# Patient Record
Sex: Male | Born: 1940 | Race: White | Hispanic: No | Marital: Single | Smoking: Former smoker
Health system: Southern US, Community
[De-identification: ages and names within clinical notes are randomized; demographics above are authoritative.]

## PROBLEM LIST (undated history)

## (undated) DIAGNOSIS — I2510 Atherosclerotic heart disease of native coronary artery without angina pectoris: Secondary | ICD-10-CM

## (undated) DIAGNOSIS — I219 Acute myocardial infarction, unspecified: Secondary | ICD-10-CM

## (undated) DIAGNOSIS — I499 Cardiac arrhythmia, unspecified: Secondary | ICD-10-CM

## (undated) DIAGNOSIS — N400 Enlarged prostate without lower urinary tract symptoms: Secondary | ICD-10-CM

## (undated) DIAGNOSIS — I714 Abdominal aortic aneurysm, without rupture, unspecified: Secondary | ICD-10-CM

## (undated) DIAGNOSIS — J449 Chronic obstructive pulmonary disease, unspecified: Secondary | ICD-10-CM

## (undated) DIAGNOSIS — E785 Hyperlipidemia, unspecified: Secondary | ICD-10-CM

## (undated) HISTORY — DX: Abdominal aortic aneurysm, without rupture: I71.4

## (undated) HISTORY — DX: Cardiac arrhythmia, unspecified: I49.9

## (undated) HISTORY — DX: Hyperlipidemia, unspecified: E78.5

## (undated) HISTORY — DX: Benign prostatic hyperplasia without lower urinary tract symptoms: N40.0

## (undated) HISTORY — PX: CHOLECYSTECTOMY: SHX55

## (undated) HISTORY — DX: Abdominal aortic aneurysm, without rupture, unspecified: I71.40

## (undated) HISTORY — PX: APPENDECTOMY: SHX54

## (undated) HISTORY — DX: Atherosclerotic heart disease of native coronary artery without angina pectoris: I25.10

## (undated) HISTORY — DX: Chronic obstructive pulmonary disease, unspecified: J44.9

## (undated) HISTORY — DX: Acute myocardial infarction, unspecified: I21.9

---

## 1969-07-22 HISTORY — PX: FRACTURE SURGERY: SHX138

## 2000-01-25 ENCOUNTER — Inpatient Hospital Stay (HOSPITAL_COMMUNITY): Admission: EM | Admit: 2000-01-25 | Discharge: 2000-02-04 | Payer: Self-pay | Admitting: Emergency Medicine

## 2000-01-27 ENCOUNTER — Encounter: Payer: Self-pay | Admitting: Surgery

## 2000-01-27 ENCOUNTER — Encounter: Payer: Self-pay | Admitting: General Surgery

## 2000-01-28 ENCOUNTER — Encounter: Payer: Self-pay | Admitting: General Surgery

## 2000-01-29 ENCOUNTER — Encounter: Payer: Self-pay | Admitting: General Surgery

## 2000-01-30 ENCOUNTER — Encounter: Payer: Self-pay | Admitting: General Surgery

## 2000-01-31 ENCOUNTER — Encounter: Payer: Self-pay | Admitting: Surgery

## 2000-02-01 ENCOUNTER — Encounter: Payer: Self-pay | Admitting: General Surgery

## 2000-02-02 ENCOUNTER — Encounter: Payer: Self-pay | Admitting: General Surgery

## 2000-02-26 ENCOUNTER — Ambulatory Visit (HOSPITAL_COMMUNITY): Admission: RE | Admit: 2000-02-26 | Discharge: 2000-02-26 | Payer: Self-pay

## 2000-03-04 ENCOUNTER — Ambulatory Visit (HOSPITAL_COMMUNITY): Admission: RE | Admit: 2000-03-04 | Discharge: 2000-03-04 | Payer: Self-pay

## 2000-06-10 ENCOUNTER — Encounter: Payer: Self-pay | Admitting: Internal Medicine

## 2000-06-10 ENCOUNTER — Encounter: Admission: RE | Admit: 2000-06-10 | Discharge: 2000-06-10 | Payer: Self-pay | Admitting: Internal Medicine

## 2000-07-22 HISTORY — PX: ABDOMINAL AORTIC ANEURYSM REPAIR: SUR1152

## 2000-07-28 ENCOUNTER — Encounter: Payer: Self-pay | Admitting: Internal Medicine

## 2000-07-28 ENCOUNTER — Encounter: Admission: RE | Admit: 2000-07-28 | Discharge: 2000-07-28 | Payer: Self-pay | Admitting: Internal Medicine

## 2000-08-25 ENCOUNTER — Encounter: Admission: RE | Admit: 2000-08-25 | Discharge: 2000-08-25 | Payer: Self-pay | Admitting: Vascular Surgery

## 2000-08-25 ENCOUNTER — Encounter: Payer: Self-pay | Admitting: Vascular Surgery

## 2000-08-27 ENCOUNTER — Encounter: Payer: Self-pay | Admitting: Vascular Surgery

## 2000-09-01 ENCOUNTER — Ambulatory Visit (HOSPITAL_COMMUNITY): Admission: RE | Admit: 2000-09-01 | Discharge: 2000-09-01 | Payer: Self-pay | Admitting: Vascular Surgery

## 2000-09-23 ENCOUNTER — Observation Stay (HOSPITAL_COMMUNITY): Admission: RE | Admit: 2000-09-23 | Discharge: 2000-09-23 | Payer: Self-pay | Admitting: Vascular Surgery

## 2000-10-07 ENCOUNTER — Encounter: Payer: Self-pay | Admitting: Vascular Surgery

## 2000-10-07 ENCOUNTER — Inpatient Hospital Stay (HOSPITAL_COMMUNITY): Admission: RE | Admit: 2000-10-07 | Discharge: 2000-10-09 | Payer: Self-pay | Admitting: Vascular Surgery

## 2000-10-16 ENCOUNTER — Encounter: Payer: Self-pay | Admitting: Vascular Surgery

## 2000-10-16 ENCOUNTER — Ambulatory Visit (HOSPITAL_COMMUNITY): Admission: RE | Admit: 2000-10-16 | Discharge: 2000-10-16 | Payer: Self-pay | Admitting: Vascular Surgery

## 2000-11-19 ENCOUNTER — Encounter: Payer: Self-pay | Admitting: Vascular Surgery

## 2000-11-19 ENCOUNTER — Encounter: Admission: RE | Admit: 2000-11-19 | Discharge: 2000-11-19 | Payer: Self-pay | Admitting: Vascular Surgery

## 2000-12-16 ENCOUNTER — Inpatient Hospital Stay (HOSPITAL_COMMUNITY): Admission: AD | Admit: 2000-12-16 | Discharge: 2000-12-17 | Payer: Self-pay | Admitting: Cardiology

## 2000-12-16 ENCOUNTER — Encounter: Payer: Self-pay | Admitting: Cardiology

## 2001-05-27 ENCOUNTER — Encounter: Payer: Self-pay | Admitting: Vascular Surgery

## 2001-05-27 ENCOUNTER — Encounter: Admission: RE | Admit: 2001-05-27 | Discharge: 2001-05-27 | Payer: Self-pay | Admitting: Vascular Surgery

## 2002-02-22 ENCOUNTER — Ambulatory Visit (HOSPITAL_COMMUNITY): Admission: RE | Admit: 2002-02-22 | Discharge: 2002-02-22 | Payer: Self-pay | Admitting: Gastroenterology

## 2002-02-22 ENCOUNTER — Encounter (INDEPENDENT_AMBULATORY_CARE_PROVIDER_SITE_OTHER): Payer: Self-pay | Admitting: Specialist

## 2002-05-27 ENCOUNTER — Encounter: Admission: RE | Admit: 2002-05-27 | Discharge: 2002-05-27 | Payer: Self-pay | Admitting: Vascular Surgery

## 2002-05-27 ENCOUNTER — Encounter: Payer: Self-pay | Admitting: Vascular Surgery

## 2002-12-28 ENCOUNTER — Encounter: Admission: RE | Admit: 2002-12-28 | Discharge: 2003-03-28 | Payer: Self-pay | Admitting: Internal Medicine

## 2003-03-30 ENCOUNTER — Encounter: Payer: Self-pay | Admitting: Internal Medicine

## 2003-03-30 ENCOUNTER — Encounter: Admission: RE | Admit: 2003-03-30 | Discharge: 2003-03-30 | Payer: Self-pay | Admitting: Internal Medicine

## 2003-06-01 ENCOUNTER — Encounter: Admission: RE | Admit: 2003-06-01 | Discharge: 2003-06-01 | Payer: Self-pay | Admitting: Vascular Surgery

## 2004-06-06 ENCOUNTER — Encounter: Admission: RE | Admit: 2004-06-06 | Discharge: 2004-06-06 | Payer: Self-pay | Admitting: Vascular Surgery

## 2004-06-06 ENCOUNTER — Inpatient Hospital Stay (HOSPITAL_COMMUNITY): Admission: EM | Admit: 2004-06-06 | Discharge: 2004-06-12 | Payer: Self-pay | Admitting: Emergency Medicine

## 2004-06-08 ENCOUNTER — Encounter: Payer: Self-pay | Admitting: Cardiology

## 2005-01-24 ENCOUNTER — Ambulatory Visit (HOSPITAL_COMMUNITY): Admission: RE | Admit: 2005-01-24 | Discharge: 2005-01-24 | Payer: Self-pay | Admitting: Internal Medicine

## 2005-01-28 ENCOUNTER — Ambulatory Visit (HOSPITAL_COMMUNITY): Admission: RE | Admit: 2005-01-28 | Discharge: 2005-01-29 | Payer: Self-pay | Admitting: Neurosurgery

## 2006-10-08 ENCOUNTER — Ambulatory Visit (HOSPITAL_COMMUNITY): Admission: RE | Admit: 2006-10-08 | Discharge: 2006-10-08 | Payer: Self-pay | Admitting: Neurosurgery

## 2006-12-17 ENCOUNTER — Ambulatory Visit: Payer: Self-pay | Admitting: Vascular Surgery

## 2008-07-19 ENCOUNTER — Ambulatory Visit: Payer: Self-pay | Admitting: Vascular Surgery

## 2010-12-04 NOTE — Assessment & Plan Note (Signed)
OFFICE VISIT   Jimmy Evans, Jimmy Evans  DOB:  1940/12/06                                       12/17/2006  EAVWU#:[id number]   SUBJECTIVE:  I saw Jimmy Evans in the office today for follow up after  previous placement of an AneuRx stent graft back in March of 2002.  His  aneurysm shrunk down nicely around the stent graft after his repair;  therefore we have only been following this at year-and-a-half intervals.  He comes in for a routine follow up visit.  He has had no significant  abdominal pain.  He has had some low back pain and is followed by Dr.  Jordan Likes who has done some work on his lumbar spine.  He apparently is going  to require some more surgery in the Fall.   REVIEW OF SYSTEMS:  On a review of systems he has had no significant  chest pain, chest pressure, palpitations, or arrhythmias.  He has had no  bronchitis, asthma or wheezing.  He has had no change in his bowel  habits.  He has had no claudication,  rest pain and denied any ulcers.   SOCIAL HISTORY:  He does continue to smoke a pack per day of cigarettes.   PHYSICAL EXAMINATION:  Blood pressure is 98/59, heart rate is 69.  I did  not detect any carotid bruits.  Lungs are clear bilaterally to  auscultation.  On cardiac exam he has a regular rate and rhythm.  His  abdomen is soft and nontender.  He has palpable femoral and pedal pulses  bilaterally.   I did review his duplex scan which shows the maximum diameter of his  aneurysm is 3.2 cm and has not changed significantly over the last year  and a half.  Renal arteries are patent and the graft is noted to be  patent with no evidence of endoleak.   ASSESSMENT/PLAN:  Overall I am pleased with his progress and I will see  him back in a year and a half.  He knows to call sooner if he has  problems.   Di Kindle. Edilia Bo, M.D.  Electronically Signed   CSD/MEDQ  D:  12/17/2006  T:  12/17/2006  Job:  [id number]

## 2010-12-04 NOTE — Procedures (Signed)
ENDOVASCULAR STENT GRAFT EXAM   INDICATION:  Followup abdominal aortic aneurysm stent.   HISTORY:  Abdominal aortic aneurysm stent placed on 10/07/2000.                           DUPLEX EVALUATION   AAA Sac Size:                 3.3 CM AP             3.0 CM TRV  Previous Sac Size:            3.2 CM AP             CM TRV  Evidence of an endoleak?      No   Velocity Criteria:  Proximal Aorta                Not visualized  Proximal Stent Graft          65 cm/sec  Main Body Stent Graft-Mid     54 cm/sec  Right Limb-Proximal           63 cm/sec  Right Limb-Distal             51 cm/sec  Left Limb-Proximal            46 cm/sec  Left Limb-Distal              33 cm/sec  Patent Renal Arteries?        Yes    IMPRESSION:  1. Patent abdominal aortic aneurysm stent with no evidence of stenosis      or leakage noted.  2. The proximal aorta was not adequately visualized due to overlying      bowel gas patterns.  3. No significant change noted when compared to the previous exam on      12/17/2006.         ___________________________________________  Di Kindle. Edilia Bo, M.D.   CH/MEDQ  D:  07/20/2008  T:  07/20/2008  Job:  [id number]

## 2010-12-04 NOTE — Procedures (Signed)
LOWER EXTREMITY ARTERIAL EVALUATION-SINGLE LEVEL   INDICATION:  Follow-up abdominal aortic aneurysm stent.   HISTORY:  Diabetes:  No.  Cardiac:  Atrial fibrillation.  Hypertension:  No.  Smoking:  Yes.  Previous Surgery:  Abdominal aortic aneurysm stent placed on 10/07/2000.   RESTING SYSTOLIC PRESSURES: (ABI)                          RIGHT                LEFT  Brachial:               120                  116  Anterior tibial:          96  (0.8)            104  (0.87)  Posterior tibial:       90                    86  Peroneal:  DOPPLER WAVEFORM ANALYSIS:  Anterior tibial:        Biphasic             Biphasic  Posterior tibial:       Biphasic             Biphasic  Peroneal:   PREVIOUS ABI'S:  Date:  RIGHT:  LEFT:   IMPRESSION:  Mildly decreased bilateral ankle brachial indices with  biphasic Doppler waveforms noted.   ___________________________________________  Jimmy Evans. Edilia Bo, M.D.   CH/MEDQ  D:  07/19/2008  T:  07/19/2008  Job:  [id number]

## 2010-12-04 NOTE — Assessment & Plan Note (Signed)
OFFICE VISIT   ARLEY, SALAMONE R  DOB:  [birth year]                                       07/19/2008  [id number]#:[id number]   I saw the patient in the office today for continued followup of his  AneuRx stent graft which was placed for a 5 cm aneurysm back in March of  2002.  On his followup studies the aneurysm slowly has shrunk around the  stent graft with no evidence of endoleak.  We have been following this  at year and a half intervals.  Since I last saw him in May of 2008 he  has had no significant abdominal pain.  He does have some low back pain  and is being considered for a lumbar fusion.   PAST MEDICAL HISTORY:  Has not changed significantly since I saw him  last.   REVIEW OF SYSTEMS:  He has had no recent chest pain, chest pressure,  palpitations or arrhythmias.  He has had no productive cough bronchitis,  asthma or wheezing.  Unfortunately he does continue to smoke  intermittently.   PHYSICAL EXAMINATION:  General:  This is a pleasant 70 year old  gentleman who appears his stated age.  Vital signs:  Blood pressure is  108/78, heart rate is 86.  Neck:  Supple.  I do not detect any carotid  bruits.  Lungs:  Are clear bilaterally to auscultation.  Cardiac:  He  has a regular rate and rhythm.  Abdomen:  Soft and nontender.  I cannot  palpate his aneurysm.  Vascular:  He has palpable femoral pulses with  warm and well-perfused feet and no significant lower extremity swelling.   Duplex scan of his graft today shows the maximum size of the aneurysm is  3.3 cm and has not changed over the last year and a half.  The stent  graft appears to be in good position with patent renal arteries.   Overall I am pleased with his progress and given that the aneurysm has  shrunk down around the graft I think we can stretch his followup out to  2 years.  I will see him back at that time.  He knows to call sooner if  he has problems.   Di Kindle. Edilia Bo, M.D.  Electronically Signed   CSD/MEDQ  D:  07/19/2008  T:  07/20/2008  Job:  [id number]

## 2010-12-07 NOTE — Op Note (Signed)
   TNAMEBRAYDYN, SCHULTES                            ACCOUNT NO.:  [id number]   MEDICAL RECORD NO.:  [id number]                   PATIENT TYPE:  AMB   LOCATION:  ENDO                                 FACILITY:  [facility]   PHYSICIAN:  Charolett Bumpers, M.D.             DATE OF BIRTH:   DATE OF PROCEDURE:  02/22/2002  DATE OF DISCHARGE:  02/22/2002                                 OPERATIVE REPORT   PROCEDURE:  Colonoscopy and polypectomy.   INDICATIONS FOR PROCEDURE:  Mr. Jimmy Evans is a 70 year old male born January 08, 1941. Mr. Jimmy Evans is undergoing diagnostic colonoscopy to evaluate guaiac  positive stool.   I discussed with Mr. Jimmy Evans the complications associated with colonoscopy and  polypectomy including a 15/1000 risk of bleeding and 10/998 risk of colon  perforation requiring surgical repair. Mr. Jimmy Evans has signed the operative  permit.   ENDOSCOPIST:  Charolett Bumpers, M.D.   PREMEDICATION:  Versed 5 mg, Demerol 50 mg .   ENDOSCOPE:  Olympus pediatric colonoscope.   DESCRIPTION OF PROCEDURE:  After obtaining informed consent, Mr. Jimmy Evans was  placed in the left lateral decubitus position. I administered intravenous  Demerol and intravenous Versed to achieve conscious sedation for the  procedure. The patient's blood pressure, oxygen saturation and cardiac  rhythm were monitored throughout the procedure and documented in the medical  record.   Anal inspection was normal. Digital rectal exam revealed a nonnodular  prostate. The Olympus pediatric video colonoscope was introduced into the  rectum and advanced to the cecum. Colonic preparation for the exam today was  satisfactory.   RECTUM:  Normal.   SIGMOID COLON AND DESCENDING COLON:  Normal.   SPLENIC FLEXURE:  Normal.   TRANSVERSE COLON:  Normal.   HEPATIC FLEXURE:  Normal.   ASCENDING COLON:  From the mid ascending colon, a 3 mm sessile polyp was  removed with electrocautery snare and submitted for pathological  interpretation.   CECUM AND ILEOCECAL VALVE:  Normal.   ASSESSMENT:  From the mid ascending colon, a 3 mm sessile polyp was removed  with electrocautery snare; otherwise normal proctocolonoscopy to the cecum.   RECOMMENDATIONS:  If ascending polyp returns neoplastic pathologically, Mr.  Jimmy Evans should undergo a repeat colonoscopy in five years.                                                 Charolett Bumpers, M.D.    MKJ/MEDQ  D:  02/22/2002  T:  02/26/2002  Job:  [id number]   cc:   Thora Lance, M.D.
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
## 2010-12-07 NOTE — Discharge Summary (Signed)
NAMEDAMIER, DISANO NO.:  [id number]   MEDICAL RECORD NO.:  [id number]          PATIENT TYPE:  INP   LOCATION:  4737                         FACILITY:  [facility]   PHYSICIAN:  Jimmy Evans, M.D.  DATE OF BIRTH:  24-May-1941   DATE OF ADMISSION:  06/07/2004  DATE OF DISCHARGE:  06/12/2004                                 DISCHARGE SUMMARY   HISTORY OF PRESENT ILLNESS:  Jimmy Evans is a 70 year old white male with a  history of known chronic atrial fibrillation, a history of tobacco abuse,  hypercholesterolemia and vascular disease, who presents with symptoms of  prolonged chest pain radiating down his left arm.  It is associated with  shortness of breath.  He also has a persistent cough.  The patient has had  prior stent graft for abdominal aortic aneurysm.  He had been evaluated in  2002 with a Cardiolite study which showed a question of a reversible  inferior wall defect.  Subsequent cardiac catheterization showed only  nonobstructive coronary disease.  For details of his past medical history,  social history, family history and physical exam, please see the admission  history and physical.   LABORATORY DATA:  The white count was 7700, hemoglobin 12, hematocrit 34.3  and platelets 177,000.  Coagulation times were normal.  Sodium 134,  potassium 3.4, chloride 107, CO2 23, BUN 8, creatinine 1.0, glucose 98,  calcium 8.0.  The BNP level was 327.  The TSH was 3.89.  The ferritin level  was 1.82.  ECG on admission showed atrial fibrillation with a rapid  ventricular response and nonspecific ST-T wave abnormality.  The patient had  CT of the chest and abdomen.  This showed an aortic stent graft that  appeared to be in excellent position.  There was no obvious endo leak.  The  abdomen appeared benign.  Chest CT showed no evidence of pulmonary embolus  or thoracic aortic dissection.  There was evidence of emphysema, but no  active disease.  Chest x-ray showed mild cardiac  enlargement.  Cardiac  enzymes showed a troponin of 2.86 and subsequent repeat was 3.05.  CK was  205 with an MB of 20.9.  Repeat was 214 with 19.3 MB.   HOSPITAL COURSE:  The patient was admitted to the intensive care unit.  He  was treated with anticoagulation with heparin.  He was started on an IV  nitroglycerin drip.  Subsequent cardiac enzymes suggested a non-Q-wave  myocardial infarction, although his ECG showed no significant change.  It  was evident from his history that he has a history of tobacco abuse, as well  as alcohol abuse, drinking up to a fifth per week by his confession.  Potassium was low and this was repleted.  On June 07, 2004, he underwent  cardiac catheterization.  This demonstrated scattered nonobstructive  coronary disease.  There was 20% in the proximal LAD and 20-40% disease in  the mid LAD.  The left circumflex and right coronary artery had less than or  equal to 10% irregularities.  Left ventricular function showed global  hypokinesia with an ejection fraction of 40%.  There was no mitral  insufficiency.  The major finding on this exam was the fact that his left  ventricular function had decreased significant since 2002.  This was felt to  be partly explained by his history of alcohol abuse, as well as history of  atrial fibrillation with perhaps insufficient heart rate control.  Because  of his left ventricular function, we stopped his Cardizem and Toprol.  He  was begun on Coreg, Lanoxin and ACE inhibitor.  He was continued on  anticoagulation with heparin and begun on Coumadin therapy.  As mentioned,  his TSH and ferritin were normal.  SPAP was pending at the time of  discharge.  He had an echocardiogram obtained which showed similar findings  with global hypokinesis and overall moderate left ventricular dysfunction  with ejection fraction 30-40%.  There was mild biatrial enlargement.  Valvular function was normal.  The patient also was treated with  Zithromax  for his apparent bronchitis with persistent cough.  This resulted in marked  improvement in his cough symptoms that had resolved by the time of  discharge.  He continued to do very well.  We had to progressively titrate  his medications to achieve adequate rate control, but with a combination of  Coreg 25 mg b.i.d. and Lanoxin 0.375 mg daily, he appeared to have achieved  excellent rate control.  While his Coumadin was subtherapeutic at the time  of discharge, it was noted that he had been in chronic atrial fibrillation  off of Coumadin prior to admission.  So while his discharge INR was 1.4, it  was felt that this could be adjusted further as an outpatient.  The patient  was asymptomatic and stable at discharge.  Prior to discharge, his digoxin  level was 0.6.  The pro time was 15.9 with an INR of 1.4.  The CBC and BMET  were normal.  The patient was strongly counseled on the need for both  alcohol and tobacco abstinence.  He is very interested in pursuing these  goals.   DISCHARGE DIAGNOSES:  1.  Dilated nonischemic cardiomyopathy.  2.  Atrial fibrillation, chronic.  3.  Anticoagulation.  4.  Acute bronchitis, resolved.  5.  History of alcohol abuse.  6.  History of tobacco abuse.  7.  Hypercholesterolemia.  8.  Status post stent graft for abdominal aortic aneurysm.  9.  Nonobstructive coronary disease.   DISCHARGE MEDICATIONS:  1.  Wellbutrin XL 300 mg per day.  2.  Lipitor 20 mg per day.  3.  Flomax 0.4 mg daily.  4.  Potassium 20 mEq per day.  5.  Lisinopril 10 mg per day.  6.  Furosemide 20 mg per day.  7.  Coreg 25 mg twice a day.  8.  Lanoxin 0.375 mg daily.  9.  Coumadin 5 mg.  He is to take two tablets on Tuesday, Wednesday and      Thursday.   FOLLOWUP:  He is to come to our office on Friday, June 15, 2004, for his  next pro time.  Otherwise he will follow up with Dr. [place] in two weeks.  SPECIAL INSTRUCTIONS:  He will abstain from alcohol and  smoking.   DIET:  He will follow a low-salt, low-fat diet.   DISCHARGE STATUS:  Improved.       PMJ/MEDQ  D:  06/12/2004  T:  06/12/2004  Job:  [id number]   cc:   Thora Lance, M.D.  [address]  [place]  [place] [address]  Fax: [phone]   Di Kindle. Edilia Bo, M.D.  [address]  [place]  [place] [address]

## 2010-12-07 NOTE — Cardiovascular Report (Signed)
NAMEMARQUE, RADEMAKER NO.:  [id number]   MEDICAL RECORD NO.:  [id number]          PATIENT TYPE:  INP   LOCATION:  6522                         FACILITY:  [facility]   PHYSICIAN:  Peter M. Swaziland, M.D.  DATE OF BIRTH:  [birth year]   DATE OF PROCEDURE:  DATE OF DISCHARGE:                              CARDIAC CATHETERIZATION   INDICATION FOR PROCEDURE:  Patient is a 70 year old white male who presented  after a prolonged episode of chest pain and had positive cardiac troponins.  He has a history of chronic atrial fibrillation and also has a history of  chronic alcohol and tobacco abuse.   PROCEDURE:  1.  Left heart catheterization.  2.  Coronary angiography.  3.  Left ventricular angiography.   ACCESS:  Via the right femoral artery using standard Seldinger technique.   EQUIPMENT:  6-French 4 cm right and left Judkins catheter, 6-French pigtail  catheter, 6-French arterial sheath.   CONTRAST:  100 mL of Omnipaque.   MEDICATIONS:  Versed 2 mg IV.   HEMODYNAMIC DATA:  1.  Aortic pressure 101/65 with a mean of 81 mmHg.  2.  Left ventricular pressure 107 with EDP 19 mmHg.   ANGIOGRAPHIC DATA:  1.  The left coronary artery arises and distributes normally.  The left main      coronary artery is normal.  2.  Left anterior descending artery has 20% irregularity in the proximal      vessel.  There is tapering down to 30-40% in the mid vessel, but no      focal stenosis in this region.  3.  The left circumflex coronary artery is a codominant vessel.  There are      minor irregularities less than 10%.  4.  The right coronary artery arises normally.  There is a 10-20% narrowing      in the mid vessel.   LEFT VENTRICULOGRAPHY:  Left ventricular angiography was performed in the  RAO view.  This demonstrates normal left ventricular size with global  hypokinesia.  There is slightly more hypokinesia involving the inferior  wall.  Overall ejection fraction is 40% consistent  with moderate left  ventricular dysfunction.  There is no significant mitral insufficiency.   FINAL INTERPRETATION:  1.  Minimal nonobstructive atherosclerotic coronary artery disease.  2.  Moderate left ventricular dysfunction.   PLAN:  Recommend medical therapy.       PMJ/MEDQ  D:  06/07/2004  T:  06/08/2004  Job:  [id number]   cc:   Thora Lance, M.D.  [address]  [place]  [place] [address]  Fax: [phone]
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
## 2010-12-07 NOTE — Discharge Summary (Signed)
[place]. [facility]  Patient:    Jimmy Evans, Jimmy Evans                         MRN: [id number] Dictator:   Marlowe Kays, P.A.                           Discharge Summary  ADDENDUM:  The patient was scheduled for an aneurysm stent graft repair on September 30, 2000.  However, the EKG taken during preoperative work-up revealed atrial fibrillation.  His surgery was canceled for further evaluation of his case.  It is now scheduled for October 07, 2000, if no other complications arise. DD:  09/23/00 TD:  09/23/00 Job: [id number]

## 2010-12-07 NOTE — Op Note (Signed)
Jimmy Evans, Jimmy Evans                   ACCOUNT NO.:  [id number]   MEDICAL RECORD NO.:  [id number]          PATIENT TYPE:  OIB   LOCATION:  3013                         FACILITY:  [facility]   PHYSICIAN:  Kathaleen Maser. Pool, M.D.    DATE OF BIRTH:  [birth year]   DATE OF PROCEDURE:  01/28/2005  DATE OF DISCHARGE:                                 OPERATIVE REPORT   SERVICE:  Neurosurgery.   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   PROCEDURE NAME:  Left 3-4 laminotomy and microdiskectomy.   SURGEON:  Julio Sicks, M.D.   ASSISTANT:  Donalee Citrin, M.D.   ANESTHESIA:  General orotracheal.   INDICATIONS:  Mr. Whetsell is a 70 year old male with a history of severe back  and left lower extremity pain consistent with a left-sided L3 radiculopathy,  failing all conservative management.  Workup demonstrates evidence of a  large left sided L3-4 disk herniation with a superior free fragment causing  marked compression of the left sided L3 nerve root.  The patient had been  counseled as to his options.  He decided to proceed with a left sided  laminotomy and microdiskectomy in hopes of improvement of his symptoms.   OPERATIVE NOTE:  The patient on the operating table in a supine position.  After adequate level of anesthesia achieved, the patient was prone on the  Wilson frame and appropriately padded.  Lumbar regions were prepped and  draped sterilely.  A 10 blade was used to make a linear skin incision  overlying the L3-4 interspace.  This was carried down sharply in the  midline.  A subperiosteal dissection was then performed exposing the lamina  facet joints of L3 and L4 on the left side.  Deep self retaining retractor  was placed.  Intraoperative x-rays taken, level was confirmed.  A laminotomy  was then performed using a high speed drill and Kerrison rongeurs to remove  the  inferior aspect of the lamina of L3, medial aspect of the L3-4 facet  joint and superior rim of the L4 lamina.  The ligamentum flavum was then  elevated and resected in the usual fashion using Kerrison rongeurs.  The  underlying thecal sac and exiting L4 nerve root were identified.  Microscope  was brought into the field.  I did microdissection of the spinal canal and  disk herniation.  __________.  The thecal sac and L4 nerve root were  mobilized and directed towards the midline.  The superior free fragment was  readily identified.  This was dissected free using blunt nerve hooks and the  pituitary rongeurs.  A very large amount of free superior fragmented disk  herniation was removed all the way up to the level of the left sided L3  pedicle.  The L3 nerve root was identified and found to be free of any  further compression.  After all elements of the superior disk herniation  were removed, the disk space was isolated and a size 15 blade in  a  rectangular fashion and wide disk space clean out was then achieved using  pituitary rongeurs, up-biting rongeurs and Epstein curets.  All __________  disk material was removed from the interspace.  All elements of the disk  herniation had been completely resected.  Wound was then irrigated with  antibiotic solution.  Gelfoam was placed topically and hemostasis found to  be good.  Microscope and retractor system were removed.  Hemostasis of the  muscle was achieved with electrocautery.  The wound was then closed in  layers with Vicryl sutures.  Steri-Strips and sterile dressing were applied.  There were no apparent operative complications.  He tolerated the procedure  well and he returned to the recovery room postop.       HAP/MEDQ  D:  01/28/2005  T:  01/28/2005  Job:  [id number]

## 2010-12-07 NOTE — Cardiovascular Report (Signed)
[place]  Patient:    Jimmy Evans, Jimmy Evans                          MRN: [id number] Proc. Date: 12/17/00 Adm. Date:  [id number] Attending:  [place], Peter Manning CC:         Di Kindle. Edilia Bo, M.D.             Thora Lance, M.D.                        Cardiac Catheterization  INDICATIONS FOR PROCEDURE:  The patient is a 70 year old, white male, with a history of tobacco abuse and hypercholesterolemia and a history of paroxysmal atrial fibrillation, who presents with refractory chest pain.  The patient had a prior stress Cardiolite study in February of 2002 that was equivocal showing a fixed defect inferiorly.  ACCESS:  Via the right femoral artery using the standard Seldinger technique.  EQUIPMENT:  A 6 French 4 cm right and left Judkins catheter, 6 French pigtail catheter, 6 French arterial sheath.  MEDICATIONS:  Local anesthesia with 1% Xylocaine.  CONTRAST:  Omnipaque 130 cc.  HEMODYNAMIC DATA:  Aortic pressure is 99/62, left ventricular pressure is 97 with an EDP of 15 mmHg.  The patient was in normal sinus rhythm during the procedure.  ANGIOGRAPHIC DATA:  Left coronary artery:  The left coronary artery arises and distributes normally.  Left main:  The left main coronary artery is normal.  Left anterior descending:  The left anterior descending artery has minimal ectasia proximally.  The LAD tapers down in the mid vessel but no significant plaque or obstruction is seen.  There is only about a 10% irregularity in the proximal LAD.  Left circumflex:  The left circumflex appears normal.  Right coronary artery:  The right coronary artery arises and distributes normally and is a normal vessel.  LEFT VENTRICULAR ANGIOGRAPHY:  The left ventricular angiography is performed in the RAO and LAO cranial views.  This demonstrates normal left ventricular size and contractility with normal systolic function.  The ejection fraction is estimated  at 55%.  There is no mitral regurgitation or prolapse.  FINAL INTERPRETATION: 1. No significant atherosclerotic coronary artery disease. 2. Normal left ventricular function. DD:  12/17/00 TD:  12/17/00 Job: [id number]

## 2010-12-07 NOTE — H&P (Signed)
NAMEFERLIN, Jimmy Evans NO.:  [id number]   MEDICAL RECORD NO.:  [id number]          PATIENT TYPE:  EMS   LOCATION:  ED                           FACILITY:  [facility]   PHYSICIAN:  Jimmy Evans, M.D. DATE OF BIRTH:  Feb 15, 1941   DATE OF ADMISSION:  06/06/2004  DATE OF DISCHARGE:                                HISTORY & PHYSICAL   HISTORY:  Jimmy Evans is a 70 year old gentleman with a history of an abdominal  aortic aneurysm repair (wall stent), chronic atrial fibrillation who was  admit with chest pain.   The patient has a history of atrial fibrillation dating back to 2001.  He  has been seen by Dr. Swaziland in the past but has not been seen in 2 1/2  years.  He had an abnormal Cardiolite study in 2002 which revealed a fixed  inferior defect.  He had a cath several months following that just before he  had his abdominal aortic aneurysm repair. The cath revealed only mild  irregularities.  The tightest stenosis was perhaps 10%. The patient had a  stent graft repair of his abdominal aortic aneurysm and has done fairly  well.  He has done well and has not been seen by Dr. Swaziland in quite some  time.   Today, he went for a chest x-ray and further evaluation of his stent graft.  He was not found to have any abnormalities.   Later in the day, he developed severe chest pain.  The pain was substernal  in nature and radiated out to the left arm.  It was described as somewhat of  a tearing and burning sensation.  The pain was not relieved with antacids.  He came to the emergency room and the pain was fairly quickly relieved with  sublingual nitroglycerin.  He had some very vague chest pains but no severe  pains at this point.   CURRENT MEDICATIONS:  1.  Wellbutrin XL 300 mg q.d.  2.  Diltiazem slow release 120 mg q.d.  3.  Flomax 0.4 mg q.d.  4.  Lipitor 20 mg q.d.  5.  Toprol XL 25 mg q.d.   ALLERGIES:  None.   SOCIAL HISTORY:  The patient is a long time smoker. He  quit smoking (once  again) approximately 1-2 weeks ago.  He works as a Warehouse manager.   FAMILY HISTORY:  Noncontributory.   REVIEW OF SYMPTOMS:  The patient has done fairly well. He has not had any  episodes of angina.   PHYSICAL EXAMINATION:  GENERAL:  He is a middle-aged gentleman in no acute  distress.  He is alert and oriented x3 and his mood and affect are normal.  VITAL SIGNS:  His heart rate is 100, his blood pressure is 124/81, his  respiratory rate is normal.  HEENT:  He has no JVD and normal carotids.  LUNGS:  Clear to auscultation.  HEART:  Irregularly irregular.  EXTREMITIES:  He has good pulses.  He has symmetric pulses in both arms. He  has no clubbing, cyanosis or edema.  NEUROLOGIC:  Nonfocal.   His EKG reveals atrial fibrillation with a rapid ventricular response.  He  has nonspecific ST-T wave abnormalities.   His laboratory data reveals a white blood cell count of 7.0, hematocrit of  40.  Sodium is 142, potassium is 4.0, chloride is 107, CO2 is 25, BUN is 13,  creatinine is 1.4.  His pro time is 13.  CPK-MB is 1.6 with a troponin of  0.05.   His chest x-ray CT is currently being performed and is pending.   IMPRESSION/PLAN:  Chest pain.  The chest pain is suspicious for coronary  artery disease.  He had minimal coronary artery disease by heart  catheterization approximately three years ago but his history today is  certainly suspicious.  He has continued smoking.   He has aneurysm repair checked out by x-ray today but certainly he could  have a thoracic aneurysm dissection.  The quality of his pain is not all  that suspicious but certainly he has known aortic disease.  We will admit  him to the hospital. We will check cardiac enzymes and check and EKG in the  morning.  Dr. Swaziland will see him for further evaluation and management.      PJN/MEDQ  D:  06/06/2004  T:  06/07/2004  Job:  [id number]   cc:   Thora Lance, M.D.  [address] [place]  [place] [address]  Fax: [phone]   Peter M. Swaziland, M.D.  [address]  [place], [place] [address]  Fax: [phone]   Di Kindle. Edilia Bo, M.D.  [address]  [place]  [place] [address]
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
## 2010-12-07 NOTE — Discharge Summary (Signed)
[place]. [facility]  Patient:    Jimmy, Evans                          MRN: [id number] Adm. Date:  [id number] Disc. Date: [id number] Attending:  [place], Peter Manning                           Discharge Summary  HISTORY OF PRESENT ILLNESS:  Jimmy Evans is a 70 year old white male who has a history of prior stent grafting for abdominal aortic aneurysm on October 05, 2000.  Since then he has complained of left precordial chest tightness.  This is not exertional.  The evening of admission developed severe left precordial pain with a burning sensation, nausea, vomiting, sweating, and weakness.  He also had left arm pain.  The patient had had prior Adenosine Cardiolite study earlier this year which showed a fixed defect inferiorly.  He was admitted to rule out myocardial infarction.  PAST MEDICAL HISTORY: SOCIAL HISTORY: FAMILY HISTORY: PHYSICAL EXAMINATION:  For details, please see admission history and physical.  HOSPITAL COURSE:  The patient was admitted to telemetry.  Subsequently ruled out for myocardial infarction by serial cardiac enzymes.  He was started on IV heparin.  Initially the patient was in atrial fibrillation in our office, but by the time he was admitted to the hospital, he had converted to normal sinus rhythm.  He was continued on aspirin and heparin.  On Dec 17, 2000, the patient underwent cardiac catheterization.  This demonstrated mild ectasia in the left anterior descending artery with approximately 10% irregularity proximally.  The circumflex and right coronary artery appeared normal.  His left ventricular function was normal and ejection fraction 55%.  The patient also had an echocardiogram performed, but the results are not on the chart at the time of dictation.  Further review of his history, he had had increased alcohol use recently over the [facility].  It was felt that this may have triggered his atrial fibrillation episode.   The patient was discharged on the same day.  DISCHARGE DIAGNOSES: 1. Atrial fibrillation with rapid ventricular response, resolved. 2. Chest pain, noncardiac. 3. Tobacco abuse. 4. Chronic obstructive pulmonary disease. 5. Status post stent graft for abdominal aortic aneurysm.  DISCHARGE MEDICATIONS: 1. Aspirin 325 mg per day. 2. Cardizem CD 240 mg a day. 3. Zantac or Pepcid A.C. p.r.n. indigestion.  DIET:  The patient is to follow a low fat diet.  DISCHARGE INSTRUCTIONS:  He is instructed to stop smoking and to avoid alcohol. He will follow up with Dr. Swaziland in 7 to 10 days.  CONDITION ON DISCHARGE:  Improved. DD:  12/25/00 TD:  12/26/00 Job: [id number]
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
## 2010-12-07 NOTE — Discharge Summary (Signed)
NAMEBRIA, PORTALES NO.:  [id number]   MEDICAL RECORD NO.:  [id number]          PATIENT TYPE:  INP   LOCATION:  4737                         FACILITY:  [facility]   PHYSICIAN:  Peter M. Swaziland, M.D.  DATE OF BIRTH:  12-05-1940   DATE OF ADMISSION:  06/06/2004  DATE OF DISCHARGE:  06/12/2004                                 DISCHARGE SUMMARY   HISTORY OF PRESENT ILLNESS:  Mr. Ayre is a 70 year old, white male status  post stent graft for an abdominal aortic aneurysm.  He has a history of  chronic atrial fibrillation.  He was admitted with symptoms of severe chest  pain.  The patient does have a history of tobacco and alcohol abuse.  He  quit smoking 1-2 weeks prior to admission.   For details his past medical history, social history, family history and  physical examination please see admission history and physical.   LABORATORY DATA:  His EKG showed atrial fibrillation with rapid ventricular  response, rate of 110, and nonspecific T wave abnormality.  He had a CT of  the chest and abdomen with contrast.  The stent graft was patent.  There did  not appear to be an endo leak.  There was no evidence of pulmonary embolus  or thoracic dissection.  He had evidence of emphysema.  There was also fatty  infiltration of the liver.  Chest x-ray showed upper limits of normal heart  size with mild peri-bronchial thickening.  Additional laboratory data, white  count was 7000, hemoglobin 14, hematocrit 39.9, platelets 199,000.  PT was  13 with INR of 1.0. Sodium 142, potassium 4.1, chloride 107, CO2 25. BUN 13,  creatinine 1.4, glucose 112.  Liver function studies were normal except  total bilirubin of 2.0.  CK was 205 with 20.9 MB, followup CK was 214 with  19.3 MB.  Troponin was 2.86 and 3.05.  Serum protein electrophoresis showed  nonspecific pattern.  BNP level was elevated at 327.  TSH 3.892, ferritin  182, digitalis level was 0.6.   HOSPITAL COURSE:  The patient was  admitted.  He was treated with IV heparin  and IV nitroglycerin.  He was also on Cardizem and Toprol for rate control.  Echocardiogram was obtained which demonstrated moderate global hypokinesia  with ejection fraction estimated at 30-40%.  Both atria were mildly dilated.  There were no significant valvular abnormalities.  On June 07, 2004 the  patient underwent a cardiac catheterization.  This demonstrated non-  obstructive coronary artery disease.  There was 20-40% narrowing in the mid  LAD with less than 10% irregularity of the circumflex and right coronary  artery.  Again, left ventricular function was globally depressed with  ejection fraction estimated at 40%.  Based on this data it was felt the  patient's moderate cardiomyopathy was related to chronic alcohol abuse.  He  was counseled on the need for alcohol and tobacco abstinence.   We changed his Cardizem and Toprol to a combination of Lanoxin and Coreg for  rate control.  He was started on ACE inhibitor and  this was progressive  titrated up.  With increasing doses of Lanoxin and Coreg we did achieve  adequate rate control.  He was started on anticoagulation with Coumadin.  He  also had significant cough and bronchitis which was treated with Zithromax  with resolution of his symptoms.   At the time of discharge his Pro Time was elevated at 15.9 with INR of 1.4.  It was felt we could adjust his Coumadin further as an outpatient and he was  discharged home in stable condition on June 12, 2004.   DISCHARGE DIAGNOSES:  1.  Non-Q wave myocardial infarction with non-obstructive coronary disease      by catheterization.  2.  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  3.  Alcohol abuse.  4.  Tobacco abuse.  5.  Chronic atrial fibrillation.  6.  Status post stent graft for abdominal aortic aneurysm.   DISCHARGE MEDICATIONS:  1.  Wellbutrin XL 300 mg daily.  2.  Lipitor 20 mg daily.  3.  Flomax 0.4  mg daily.  4.  Potassium 20 mEq daily.  5.  Lisinopril 10 mg daily.  6.  Furosemide 20 mg daily.  7.  Coreg 25 mg twice a day.  8.  Lanoxin 0.375 mg daily.  9.  Coumadin 5 mg two tablets on Tuesday, Wednesday and Thursday.   Followup Pro Time Friday in the office.  The patient was counseled on a low  sodium, low fat diet.  Recommended complete alcohol and smoking cessation.  Will followup with office visit with Dr. [place] in two weeks.   Discharge status is improved.       PMJ/MEDQ  D:  07/26/2004  T:  07/26/2004  Job:  [id number]   cc:   Di Kindle. Edilia Bo, M.D.  [address]  [place]  [place] [address]   Thora Lance, M.D.  [address]  [place]  [place] [address]  Fax: [phone]
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
## 2011-09-11 ENCOUNTER — Other Ambulatory Visit: Payer: Self-pay

## 2011-09-11 ENCOUNTER — Ambulatory Visit: Payer: Self-pay | Admitting: Vascular Surgery

## 2011-09-17 ENCOUNTER — Encounter: Payer: Self-pay | Admitting: Vascular Surgery

## 2011-09-18 ENCOUNTER — Encounter (INDEPENDENT_AMBULATORY_CARE_PROVIDER_SITE_OTHER): Payer: Medicare Other | Admitting: Vascular Surgery

## 2011-09-18 ENCOUNTER — Ambulatory Visit (INDEPENDENT_AMBULATORY_CARE_PROVIDER_SITE_OTHER): Payer: Medicare Other | Admitting: Vascular Surgery

## 2011-09-18 ENCOUNTER — Encounter: Payer: Self-pay | Admitting: Vascular Surgery

## 2011-09-18 VITALS — BP 107/62 | HR 74 | Resp 16 | Ht 71.0 in | Wt 200.0 lb

## 2011-09-18 DIAGNOSIS — Z48812 Encounter for surgical aftercare following surgery on the circulatory system: Secondary | ICD-10-CM

## 2011-09-18 DIAGNOSIS — I714 Abdominal aortic aneurysm, without rupture, unspecified: Secondary | ICD-10-CM | POA: Insufficient documentation

## 2011-09-18 NOTE — Progress Notes (Signed)
[facility]ist of [place]  Patient name: Jimmy Evans MRN: [id number] DOB: Aug 31, 1940 Sex: male  REASON FOR VISIT: follow up EVAR  HPI: Jimmy Evans is a 71 y.o. male who underwent EVAR in March of 2002 with an AneuRx stent graft. His postop visits showed it is aneurysm had continued to decrease in size no evidence of endoleak. I last saw on 07/19/2008. To 3.3 cm and had been stable so we stretched is followed out to 2 years. [place] today for a follow duplex scan. He's had no abdominal pain or back pain.  There has been no significant changes in his medical history. He does have a history of coronary artery disease this had no recent heart problems. He has hyperlipidemia which is been stable his current medications.  Past Medical History  Diagnosis Date  . AAA (abdominal aortic aneurysm)   . Hyperlipidemia   . CAD (coronary artery disease)   . COPD (chronic obstructive pulmonary disease)   . BPH (benign prostatic hyperplasia)   . Irregular heart beat   . Diabetes mellitus     Family History  Problem Relation Age of Onset  . Hyperlipidemia Father     SOCIAL HISTORY: History  Substance Use Topics  . Smoking status: Former Games developer  . Smokeless tobacco: Not on file  . Alcohol Use: 0.6 oz/week    1 Shots of liquor per week    No Known Allergies  Current Outpatient Prescriptions  Medication Sig Dispense Refill  . ALPRAZolam (XANAX) 1 MG tablet Take 0.5 mg by mouth at bedtime as needed.       Marland Kitchen atorvastatin (LIPITOR) 20 MG tablet Take 20 mg by mouth daily.      . carvedilol (COREG) 6.25 MG tablet Take 6.25 mg by mouth 2 (two) times daily with a meal.      . digoxin (LANOXIN) 0.25 MG tablet Take 250 mcg by mouth daily.      Marland Kitchen warfarin (COUMADIN) 5 MG tablet Take 5 mg by mouth daily.      Marland Kitchen doxazosin (CARDURA) 4 MG tablet Take 4 mg by mouth at bedtime.        REVIEW OF SYSTEMS: Arly.Keller ] denotes positive  finding; [  ] denotes negative finding  CARDIOVASCULAR:  [ ]  chest pain   [ ]  chest pressure   [ ]  palpitations   [ ]  orthopnea   [ ]  dyspnea on exertion   [ ]  claudication   [ ]  rest pain   [ ]  DVT   [ ]  phlebitis PULMONARY:   [ ]  productive cough   [ ]  asthma   [ ]  wheezing NEUROLOGIC:   [ ]  weakness  [ ]  paresthesias  [ ]  aphasia  [ ]  amaurosis  [ ]  dizziness HEMATOLOGIC:   [ ]  bleeding problems   [ ]  clotting disorders MUSCULOSKELETAL:  [ ]  joint pain   [ ]  joint swelling [ ]  leg swelling GASTROINTESTINAL: [ ]   blood in stool  [ ]   hematemesis GENITOURINARY:  [ ]   dysuria  [ ]   hematuria PSYCHIATRIC:  [ ]  history of major depression INTEGUMENTARY:  [ ]  rashes  [ ]  ulcers CONSTITUTIONAL:  [ ]  fever   [ ]  chills  PHYSICAL EXAM: Filed Vitals:   09/18/11 1037  BP: 107/62  Pulse: 74  Resp: 16  Height: 5\' 11"  (1.803 m)  Weight: 200 lb (90.719 kg)  SpO2:  98%   Body mass index is 27.89 kg/(m^2). GENERAL: The patient is a well-nourished male, in no acute distress. The vital signs are documented above. CARDIOVASCULAR: There is a regular rate and rhythm without significant murmur appreciated. I do not detect carotid bruits. He has palpable femoral pulses. I cannot palpate pedal pulses although both feet are warm and well-perfused. PULMONARY: There is good air exchange bilaterally without wheezing or rales. ABDOMEN: Soft and non-tender with normal pitched bowel sounds. I cannot palpate his aneurysm. MUSCULOSKELETAL: There are no major deformities or cyanosis. NEUROLOGIC: No focal weakness or paresthesias are detected. SKIN: There are no ulcers or rashes noted. PSYCHIATRIC: The patient has a normal affect.  DATA:  I have independently interpreted his duplex scan of his endograft that was done in our office today. This shows that the maximum diameter of the endograft is 3.9 cm in his best increased in size 6 mm over the last 3 years. There was no evidence of endoleak detected.  MEDICAL  ISSUES: Although the increase in size is small, in may simply be due to the limits of the study, I have recommended that we obtain a CT scan of the abdomen pelvis to further assess his graft to be sure there is no evidence of endoleak. [place] so we'll try to arrange for the test be done today or tomorrow. We'll make further recommendations pending these results and also decide on what his next follow up visit as needed pending these results.  Asah Lamay S [facility] [place] Beeper: [phone]

## 2011-09-19 ENCOUNTER — Ambulatory Visit
Admission: RE | Admit: 2011-09-19 | Discharge: 2011-09-19 | Disposition: A | Discharge status: Home / Self Care | Payer: Medicare Other | Source: Ambulatory Visit | Attending: Vascular Surgery | Admitting: Vascular Surgery

## 2011-09-19 DIAGNOSIS — I714 Abdominal aortic aneurysm, without rupture: Secondary | ICD-10-CM

## 2011-09-19 MED ORDER — IOHEXOL 300 MG/ML  SOLN
100.0000 mL | Freq: Once | INTRAMUSCULAR | Status: AC | PRN
  Administered 2011-09-19: 100 mL via INTRAVENOUS

## 2011-09-20 ENCOUNTER — Other Ambulatory Visit: Payer: Self-pay

## 2011-09-20 DIAGNOSIS — I723 Aneurysm of iliac artery: Secondary | ICD-10-CM

## 2011-09-20 DIAGNOSIS — I714 Abdominal aortic aneurysm, without rupture: Secondary | ICD-10-CM

## 2011-09-23 ENCOUNTER — Other Ambulatory Visit: Payer: Self-pay | Admitting: *Deleted

## 2011-09-25 NOTE — Procedures (Unsigned)
VASCULAR LAB EXAM  INDICATION:  Follow up AAA endograft placed 10/07/2000.  HISTORY: Diabetes:  Yes. Cardiac:  A fib. Hypertension:  No. Smoking:  Previous.  EXAM:  AAA Sac Size:  3.8 CM AP, 3.9 CM TRV. Previous Sac Size:  07/19/2008,  3.3  CM AP, 3.0 CM TRV.  IMPRESSION: 1. The aorta and endograft appear patent. 2. Slight increase in size of the aneurysmal sac surrounding the     endograft. 3.  No evidence of endoleak was detected.  ___________________________________________ Di Kindle. Edilia Bo, M.D.  SH/MEDQ  D:  09/18/2011  T:  09/18/2011  Job:  [id number]

## 2012-08-11 ENCOUNTER — Encounter: Payer: Self-pay | Admitting: Neurosurgery

## 2012-08-12 ENCOUNTER — Encounter: Payer: Self-pay | Admitting: Neurosurgery

## 2012-08-12 ENCOUNTER — Ambulatory Visit (INDEPENDENT_AMBULATORY_CARE_PROVIDER_SITE_OTHER): Payer: Medicare Other | Admitting: Neurosurgery

## 2012-08-12 ENCOUNTER — Ambulatory Visit (INDEPENDENT_AMBULATORY_CARE_PROVIDER_SITE_OTHER): Payer: Medicare Other | Admitting: Vascular Surgery

## 2012-08-12 VITALS — BP 92/61 | HR 76 | Resp 16 | Ht 70.5 in | Wt 194.0 lb

## 2012-08-12 DIAGNOSIS — Z48812 Encounter for surgical aftercare following surgery on the circulatory system: Secondary | ICD-10-CM

## 2012-08-12 DIAGNOSIS — I714 Abdominal aortic aneurysm, without rupture, unspecified: Secondary | ICD-10-CM

## 2012-08-12 DIAGNOSIS — R202 Paresthesia of skin: Secondary | ICD-10-CM

## 2012-08-12 DIAGNOSIS — R238 Other skin changes: Secondary | ICD-10-CM

## 2012-08-12 DIAGNOSIS — I723 Aneurysm of iliac artery: Secondary | ICD-10-CM

## 2012-08-12 DIAGNOSIS — R200 Anesthesia of skin: Secondary | ICD-10-CM | POA: Insufficient documentation

## 2012-08-12 DIAGNOSIS — R209 Unspecified disturbances of skin sensation: Secondary | ICD-10-CM

## 2012-08-12 NOTE — Addendum Note (Signed)
Addended by: Dannielle Karvonen on: 08/12/2012 04:05 PM   Modules accepted: Orders

## 2012-08-12 NOTE — Progress Notes (Signed)
Abdominal aorta duplex post EVAR performed @ [facility] 08/12/2012

## 2012-08-12 NOTE — Progress Notes (Signed)
[facility]IN SPECIALISTS OF [place] AAA/Carotid Office Note  CC: AAA stent repair surveillance Referring Physician: Edilia Bo  History of Present Illness: 72 year old male patient of Dr. Edilia Bo status post endovascular aortic repair in 2002. The patient denies any unusual abdominal or back pain. The patient had questions about the iliac dilatation that was found on CT last year and I reassured him this had not increased.  Past Medical History  Diagnosis Date  . AAA (abdominal aortic aneurysm)   . Hyperlipidemia   . CAD (coronary artery disease)   . COPD (chronic obstructive pulmonary disease)   . BPH (benign prostatic hyperplasia)   . Irregular heart beat   . Diabetes mellitus   . Myocardial infarction     ROS: [x]  Positive   [ ]  Denies    General: [ ]  Weight loss, [ ]  Fever, [ ]  chills Neurologic: [ ]  Dizziness, [ ]  Blackouts, [ ]  Seizure [ ]  Stroke, [ ]  "Mini stroke", [ ]  Slurred speech, [ ]  Temporary blindness; [ ]  weakness in arms or legs, [ ]  Hoarseness Cardiac: [ ]  Chest pain/pressure, [ ]  Shortness of breath at rest [ ]  Shortness of breath with exertion, [ ]  Atrial fibrillation or irregular heartbeat Vascular: [ ]  Pain in legs with walking, [ ]  Pain in legs at rest, [ ]  Pain in legs at night,  [ ]  Non-healing ulcer, [ ]  Blood clot in vein/DVT,   Pulmonary: [ ]  Home oxygen, [ ]  Productive cough, [ ]  Coughing up blood, [ ]  Asthma,  [ ]  Wheezing Musculoskeletal:  [ ]  Arthritis, [ ]  Low back pain, [ ]  Joint pain Hematologic: [ ]  Easy Bruising, [ ]  Anemia; [ ]  Hepatitis Gastrointestinal: [ ]  Blood in stool, [ ]  Gastroesophageal Reflux/heartburn, [ ]  Trouble swallowing Urinary: [ ]  chronic Kidney disease, [ ]  on HD - [ ]  MWF or [ ]  TTHS, [ ]  Burning with urination, [ ]  Difficulty urinating Skin: [ ]  Rashes, [ ]  Wounds Psychological: [ ]  Anxiety, [ ]  Depression   Social History History  Substance Use Topics  . Smoking status: Former Games developer  . Smokeless tobacco: Never  Used  . Alcohol Use: 0.6 oz/week    1 Shots of liquor per week    Family History Family History  Problem Relation Age of Onset  . Hyperlipidemia Father   . Hyperlipidemia Mother     No Known Allergies  Current Outpatient Prescriptions  Medication Sig Dispense Refill  . atorvastatin (LIPITOR) 20 MG tablet Take 20 mg by mouth daily.      . B-D ULTRA-FINE 33 LANCETS MISC by Does not apply route.      Marland Kitchen buPROPion (ZYBAN) 150 MG 12 hr tablet Take 150 mg by mouth 2 (two) times daily.      . carvedilol (COREG) 6.25 MG tablet Take 6.25 mg by mouth 2 (two) times daily with a meal.      . digoxin (LANOXIN) 0.25 MG tablet Take 250 mcg by mouth daily.      Marland Kitchen doxazosin (CARDURA) 4 MG tablet Take 4 mg by mouth at bedtime.      . gabapentin (NEURONTIN) 300 MG capsule Take 300 mg by mouth 3 (three) times daily.      . insulin NPH (HUMULIN N,NOVOLIN N) 100 UNIT/ML injection Inject into the skin.      . Menthol-Methyl Salicylate (THERA-GESIC EX) Apply 85 g topically.      . metFORMIN (GLUCOPHAGE) 1000 MG tablet Take 1,000 mg by mouth 2 (two)  times daily with a meal.      . mirtazapine (REMERON) 7.5 MG tablet Take 7.5 mg by mouth at bedtime.      . Pregnancy Test (PRECISE PREGNANCY VI) by In Vitro route.      . warfarin (COUMADIN) 5 MG tablet Take 5 mg by mouth daily.      Marland Kitchen ALPRAZolam (XANAX) 1 MG tablet Take 0.5 mg by mouth at bedtime as needed.         Physical Examination  Filed Vitals:   08/12/12 1034  BP: 92/61  Pulse: 76  Resp: 16    Body mass index is 27.44 kg/(m^2).  General:  WDWN in NAD Gait: Normal HEENT: WNL Eyes: Pupils equal Pulmonary: normal non-labored breathing , without Rales, rhonchi,  wheezing Cardiac: RRR, without  Murmurs, rubs or gallops; Abdomen: soft, NT, no masses Skin: no rashes, ulcers noted  Vascular Exam Pulses: Palpable femoral pulses bilaterally, there is no abdominal pulsatile mass palpated Carotid bruits: Carotid pulses to  auscultation Extremities without ischemic changes, no Gangrene , no cellulitis; no open wounds;  Musculoskeletal: no muscle wasting or atrophy   Neurologic: A&O X 3; Appropriate Affect ; SENSATION: normal; MOTOR FUNCTION:  moving all extremities equally. Speech is fluent/normal  Non-Invasive Vascular Imaging AAA duplex shows a maximum diameter of 3.7 AP by 3.7 transverse which is a slight decrease from February 2013. Right common iliac is 1.7 AP by 1.8 transverse, left common iliac is 1.6 AP by 1.6 transverse  ASSESSMENT/PLAN: Asymptomatic patient will followup in one year with repeat aortoiliac duplex. The patient's questions were encouraged and answered, he is in agreement with this plan.  Lauree Chandler ANP   Clinic MD: Edilia Bo

## 2012-09-17 ENCOUNTER — Other Ambulatory Visit: Payer: Medicare Other

## 2012-09-17 ENCOUNTER — Ambulatory Visit: Payer: Medicare Other | Admitting: Vascular Surgery

## 2013-06-10 ENCOUNTER — Other Ambulatory Visit: Payer: Self-pay | Admitting: Neurosurgery

## 2013-06-10 DIAGNOSIS — I714 Abdominal aortic aneurysm, without rupture: Secondary | ICD-10-CM

## 2013-06-10 DIAGNOSIS — Z48812 Encounter for surgical aftercare following surgery on the circulatory system: Secondary | ICD-10-CM

## 2013-08-17 ENCOUNTER — Encounter: Payer: Self-pay | Admitting: Family

## 2013-08-18 ENCOUNTER — Ambulatory Visit (HOSPITAL_COMMUNITY)
Admission: RE | Admit: 2013-08-18 | Discharge: 2013-08-18 | Disposition: A | Discharge status: Home / Self Care | Payer: Medicare Other | Source: Ambulatory Visit | Attending: Family | Admitting: Family

## 2013-08-18 ENCOUNTER — Ambulatory Visit: Payer: Medicare Other | Admitting: Neurosurgery

## 2013-08-18 ENCOUNTER — Other Ambulatory Visit: Payer: Medicare Other

## 2013-08-18 ENCOUNTER — Encounter: Payer: Self-pay | Admitting: Family

## 2013-08-18 ENCOUNTER — Ambulatory Visit (INDEPENDENT_AMBULATORY_CARE_PROVIDER_SITE_OTHER): Payer: Self-pay | Admitting: Family

## 2013-08-18 VITALS — BP 108/73 | HR 69 | Resp 16 | Ht 71.0 in | Wt 196.0 lb

## 2013-08-18 DIAGNOSIS — I714 Abdominal aortic aneurysm, without rupture, unspecified: Secondary | ICD-10-CM

## 2013-08-18 DIAGNOSIS — Z48812 Encounter for surgical aftercare following surgery on the circulatory system: Secondary | ICD-10-CM

## 2013-08-18 DIAGNOSIS — Z01810 Encounter for preprocedural cardiovascular examination: Secondary | ICD-10-CM | POA: Insufficient documentation

## 2013-08-18 NOTE — Patient Instructions (Signed)

## 2013-08-18 NOTE — Progress Notes (Signed)
VASCULAR [facility]IN SPECIALISTS OF [place] HISTORY AND PHYSICAL -PAD   History of Present Illness Jimmy Evans is a 73 y.o. male patient of Dr. Edilia Boickson status post endovascular aortic repair in 2002. He returns today for scheduled surveillance. He has chronic low back pain which he attributes to lumbar spine issues with what appears to be bilateral radiculopathy. He reports RUQ abdominal mild pain at times, relieved by burping. Pt denies claudication pain, walks a mile a couple days/week, denies non-healing wounds, denies steal symptoms in either arm, denies history of stroke or TIA symptoms.   Pt denies New Medical or Surgical History.  Pt Diabetic: Yes, seems in control Pt smoker: former smoker, quit 5 years ago Rare ETOH use  Pt meds include: Statin :Yes Betablocker: Yes ASA: No Other anticoagulants/antiplatelets: taking coumadin for afib.  Past Medical History  Diagnosis Date  . AAA (abdominal aortic aneurysm)   . Hyperlipidemia   . CAD (coronary artery disease)   . COPD (chronic obstructive pulmonary disease)   . BPH (benign prostatic hyperplasia)   . Irregular heart beat   . Diabetes mellitus   . Myocardial infarction     Social History History  Substance Use Topics  . Smoking status: Former Games developermoker  . Smokeless tobacco: Never Used  . Alcohol Use: 0.6 oz/week    1 Shots of liquor per week    Family History Family History  Problem Relation Age of Onset  . Hyperlipidemia Father   . Hyperlipidemia Mother     Past Surgical History  Procedure Laterality Date  . Abdominal aortic aneurysm repair  2002    Endovascular repair   . Cholecystectomy    . Appendectomy    . Fracture surgery  1971    Left forearm     No Known Allergies  Current Outpatient Prescriptions  Medication Sig Dispense Refill  . ALPRAZolam (XANAX) 1 MG tablet Take 0.5 mg by mouth at bedtime as needed.       Marland Kitchen. atorvastatin (LIPITOR) 20 MG tablet Take 20 mg by mouth daily.      . B-D  ULTRA-FINE 33 LANCETS MISC by Does not apply route.      Marland Kitchen. buPROPion (ZYBAN) 150 MG 12 hr tablet Take 150 mg by mouth 2 (two) times daily.      . carvedilol (COREG) 6.25 MG tablet Take 6.25 mg by mouth 2 (two) times daily with a meal.      . digoxin (LANOXIN) 0.25 MG tablet Take 250 mcg by mouth daily.      Marland Kitchen. doxazosin (CARDURA) 4 MG tablet Take 4 mg by mouth at bedtime.      . gabapentin (NEURONTIN) 300 MG capsule Take 300 mg by mouth 3 (three) times daily.      . insulin NPH (HUMULIN N,NOVOLIN N) 100 UNIT/ML injection Inject into the skin.      . Menthol-Methyl Salicylate (THERA-GESIC EX) Apply 85 g topically.      . metFORMIN (GLUCOPHAGE) 1000 MG tablet Take 1,000 mg by mouth 2 (two) times daily with a meal.      . mirtazapine (REMERON) 7.5 MG tablet Take 7.5 mg by mouth at bedtime.      . Pregnancy Test (PRECISE PREGNANCY VI) by In Vitro route.      . warfarin (COUMADIN) 5 MG tablet Take 5 mg by mouth daily.       No current facility-administered medications for this visit.    ROS: See HPI for pertinent positives and negatives.  Physical Examination  Filed Vitals:   08/18/13 1009  BP: 108/73  Pulse: 69  Resp: 16   Filed Weights   08/18/13 1009  Weight: 196 lb (88.905 kg)   Body mass index is 27.35 kg/(m^2).  General: A&O x 3, WDWN Gait: normal Eyes: Pupils equal Pulmonary: CTAB, without wheezes , rales or rhonchi Cardiac: irregular Rythm , without detected murmur          Carotid Bruits Left Right   Negative Negative  Aorta: is not palpable Radial pulses: Right is 2+, left if not palpable. Left brachial pulse is palpable                           VASCULAR EXAM: Extremities without ischemic changes  without Gangrene; without open wounds.                                                                                                          LE Pulses LEFT RIGHT       FEMORAL   palpable   palpable        POPLITEAL  not palpable   not palpable       POSTERIOR  TIBIAL  not palpable    palpable        DORSALIS PEDIS      ANTERIOR TIBIAL  palpable  not palpable    Abdomen: soft, NT, no masses. Skin: no rashes, no ulcers noted. Musculoskeletal: no muscle wasting or atrophy.  Neurologic: A&O X 3; Appropriate Affect ; SENSATION: normal; MOTOR FUNCTION:  moving all extremities equally, motor strength 5/5 throughout. Speech is fluent/normal. CN 2-12 intact.    Non-Invasive Vascular Imaging: DATE: 08/18/2013 ABDOMINAL AORTA DUPLEX EVALUATION - POST ENDOVASCULAR REPAIR    INDICATION: Abdominal Aortic Aneurysm    PREVIOUS INTERVENTION(S): Endovascular Aortic repair with stent 10/07/2000    DUPLEX EXAM:      DIAMETER AP (cm) DIAMETER TRANSVERSE (cm) VELOCITIES (cm/sec)  Aorta 3.70 3.70 93  Right Common Iliac 1.57 N/A 48  Left Common Iliac 1.47 N/A 47    Comparison Study       Date DIAMETER AP (cm) DIAMETER TRANSVERSE (cm)  08/12/2012 3.76 3.74     ADDITIONAL FINDINGS:     IMPRESSION: Technically difficult study due to presence of bowel gas. The aorta and endograft appear patent. Patent limbs of endovascular stent with dilatation present involving the right distal common iliac artery anastomosis measuring approximately 2.05cm AP.    Compared to the previous exam:  Stable aortic sac size since previous study on 08/12/2012. Stable right common iliac artery dilatation when compared with CT performed 09/19/2011.    ASSESSMENT: Jimmy Evans is a 73 y.o. male who is status post endovascular aortic repair in 2002. Stable aortic sac size since previous study on 08/12/2012. Stable right common iliac artery dilatation when compared with CT performed 09/19/2011.  PLAN:  I discussed in depth with the patient the nature of atherosclerosis, and emphasized the importance of maximal medical management including strict control of blood pressure,  blood glucose, and lipid levels, obtaining regular exercise, and continued cessation of smoking.  The  patient is aware that without maximal medical management the underlying atherosclerotic disease process will progress, limiting the benefit of any interventions. Based on the patient's vascular studies and examination, and after discussing with Dr. Edilia Bo, pt will return to clinic in 1 year for EVAR Duplex.  The patient was given information about PAD including signs, symptoms, treatment, what symptoms should prompt the patient to seek immediate medical care, and risk reduction measures to take.  Charisse March, RN, MSN, FNP-C [facility] [place] Phone: [phone]  Clinic MD: Edilia Bo  08/18/2013 9:30 AM

## 2014-08-24 ENCOUNTER — Other Ambulatory Visit (HOSPITAL_COMMUNITY): Payer: Medicare Other

## 2014-08-24 ENCOUNTER — Ambulatory Visit: Payer: Medicare Other | Admitting: Family

## 2014-08-24 ENCOUNTER — Other Ambulatory Visit: Payer: Self-pay | Admitting: *Deleted

## 2014-08-24 DIAGNOSIS — Z48812 Encounter for surgical aftercare following surgery on the circulatory system: Secondary | ICD-10-CM

## 2014-08-24 DIAGNOSIS — I714 Abdominal aortic aneurysm, without rupture, unspecified: Secondary | ICD-10-CM

## 2014-09-27 ENCOUNTER — Encounter: Payer: Self-pay | Admitting: Vascular Surgery

## 2014-09-27 ENCOUNTER — Telehealth: Payer: Self-pay | Admitting: Vascular Surgery

## 2014-09-27 ENCOUNTER — Other Ambulatory Visit: Payer: Self-pay | Admitting: *Deleted

## 2014-09-27 DIAGNOSIS — Z01812 Encounter for preprocedural laboratory examination: Secondary | ICD-10-CM

## 2014-09-27 LAB — BUN: BUN: 17 mg/dL (ref 6–23)

## 2014-09-27 LAB — CREATININE, SERUM: Creat: 1.1 mg/dL (ref 0.50–1.35)

## 2014-09-27 NOTE — Telephone Encounter (Signed)
Spoke with pt. Jimmy [facility] from [facility] called us to let us know that the lab work that we sent them, which they said would be ok, is too old, and that pt will need to get blood work prior to tomorrow's appointment. Patient is getting into town today around 1pm. Will go [facility] to get [facility] this afternoon. [facility] with notation that CTA is tomorrow AM and results need to be sent to GI prior to CTA. GI was notified of this.

## 2014-09-28 ENCOUNTER — Encounter: Payer: Self-pay | Admitting: Vascular Surgery

## 2014-09-28 ENCOUNTER — Ambulatory Visit
Admission: RE | Admit: 2014-09-28 | Discharge: 2014-09-28 | Disposition: A | Discharge status: Home / Self Care | Payer: Medicare Other | Source: Ambulatory Visit | Attending: Vascular Surgery | Admitting: Vascular Surgery

## 2014-09-28 ENCOUNTER — Ambulatory Visit (INDEPENDENT_AMBULATORY_CARE_PROVIDER_SITE_OTHER): Payer: Medicare Other | Admitting: Vascular Surgery

## 2014-09-28 VITALS — BP 124/81 | HR 87 | Temp 97.9°F | Resp 14 | Ht 71.5 in | Wt 198.0 lb

## 2014-09-28 DIAGNOSIS — I714 Abdominal aortic aneurysm, without rupture, unspecified: Secondary | ICD-10-CM

## 2014-09-28 DIAGNOSIS — Z95828 Presence of other vascular implants and grafts: Secondary | ICD-10-CM

## 2014-09-28 DIAGNOSIS — Z48812 Encounter for surgical aftercare following surgery on the circulatory system: Secondary | ICD-10-CM

## 2014-09-28 MED ORDER — IOPAMIDOL (ISOVUE-370) INJECTION 76%
75.0000 mL | Freq: Once | INTRAVENOUS | Status: AC | PRN
  Administered 2014-09-28: 75 mL via INTRAVENOUS

## 2014-09-28 NOTE — Progress Notes (Signed)
[facility]ascular [facility] Vein Specialist of [place]  Patient name: Jimmy Evans MRN: [id number] DOB: 08/30/1940 Sex: male  REASON FOR VISIT: follow up of abdominal aortic aneurysm.  HPI: Jimmy PaiFred R Clinger is a 74 y.o. male who underwent endovascular aneurysm repair in 2002. I last saw him in February 2013. He was last seen in our office on 08/18/2013 by Rosalita ChessmanSuzanne Nickel. At that time the maximum diameter of the aneurysm was 3.7 cm and was stable compared to 3.76 cm on 08/12/2012. There was some ectasia of the right common iliac artery which we were following. The maximum diameter on the right was 1.57. The left common iliac artery measured 1.47.  Since I saw him last he denies any back pain. Hip occasionally gets some sharp pain right below his umbilicus but this lasts only briefly. He denies nausea or vomiting.  He does not take aspirin as he is afraid it will cause an ulcer. He is on a statin.    Past Medical History  Diagnosis Date  . AAA (abdominal aortic aneurysm)   . Hyperlipidemia   . CAD (coronary artery disease)   . COPD (chronic obstructive pulmonary disease)   . BPH (benign prostatic hyperplasia)   . Irregular heart beat   . Diabetes mellitus   . Myocardial infarction    Family History  Problem Relation Age of Onset  . Hyperlipidemia Mother    SOCIAL HISTORY: History  Substance Use Topics  . Smoking status: Former Smoker    Quit date: 07/22/2008  . Smokeless tobacco: Never Used  . Alcohol Use: 0.6 oz/week    1 Shots of liquor per week   No Known Allergies Current Outpatient Prescriptions  Medication Sig Dispense Refill  . ALPRAZolam (XANAX) 1 MG tablet Take 0.5 mg by mouth at bedtime as needed.     Marland Kitchen. atorvastatin (LIPITOR) 20 MG tablet Take 20 mg by mouth daily.    . B-D ULTRA-FINE 33 LANCETS MISC by Does not apply route.    Marland Kitchen. buPROPion (ZYBAN) 150 MG 12 hr tablet Take 150 mg by mouth 2 (two) times daily.    . carvedilol (COREG) 6.25 MG tablet Take 6.25 mg by mouth 2 (two)  times daily with a meal.    . digoxin (LANOXIN) 0.25 MG tablet Take 250 mcg by mouth daily.    Marland Kitchen. doxazosin (CARDURA) 4 MG tablet Take 4 mg by mouth at bedtime.    . gabapentin (NEURONTIN) 300 MG capsule Take 300 mg by mouth 3 (three) times daily.    . insulin NPH (HUMULIN N,NOVOLIN N) 100 UNIT/ML injection Inject into the skin.    . Liraglutide (VICTOZA) 18 MG/3ML SOPN Inject into the skin daily.    . Menthol-Methyl Salicylate (THERA-GESIC EX) Apply 85 g topically.    . metFORMIN (GLUCOPHAGE) 1000 MG tablet Take 1,000 mg by mouth 2 (two) times daily with a meal.    . mirtazapine (REMERON) 7.5 MG tablet Take 7.5 mg by mouth at bedtime.    . Pregnancy Test (PRECISE PREGNANCY VI) by In Vitro route.    . warfarin (COUMADIN) 5 MG tablet Take 5 mg by mouth daily.     No current facility-administered medications for this visit.   REVIEW OF SYSTEMS: Arly.Keller[X ] denotes positive finding; [  ] denotes negative finding  CARDIOVASCULAR:  [ ]  chest pain   [ ]  chest pressure   [ ]  palpitations   [ ]  orthopnea   [ ]  dyspnea on exertion   [ ]  claudication   [ ]   rest pain    DVT    phlebitis PULMONARY:    productive cough    asthma    wheezing NEUROLOGIC:    weakness   paresthesias   aphasia   amaurosis   dizziness HEMATOLOGIC:    bleeding problems    clotting disorders MUSCULOSKELETAL:   joint pain    joint swelling  leg swelling GASTROINTESTINAL:   blood in stool    hematemesis GENITOURINARY:    dysuria    hematuria PSYCHIATRIC:   history of major depression INTEGUMENTARY:   rashes   ulcers CONSTITUTIONAL:   fever    chills  PHYSICAL EXAM: Filed Vitals:   09/28/14 1244  BP: 124/81  Pulse: 87  Temp: 97.9 F (36.6 C)  TempSrc: Oral  Resp: 14  Height: 5' 11.5" (1.816 m)  Weight: 198 lb (89.812 kg)  SpO2: 100%   Body mass index is 27.23 kg/(m^2). GENERAL: The patient is a well-nourished male, in no acute distress. The vital signs  are documented above. CARDIOVASCULAR: There is a regular rate and rhythm. I do not detect carotid bruits. He has palpable femoral pulses. Both feet are warm and well-perfused. PULMONARY: There is good air exchange bilaterally without wheezing or rales. ABDOMEN: Soft and non-tender with normal pitched bowel sounds.  MUSCULOSKELETAL: There are no major deformities or cyanosis. NEUROLOGIC: No focal weakness or paresthesias are detected. SKIN: There are no ulcers or rashes noted. PSYCHIATRIC: The patient has a normal affect.  DATA:  I have interpreted his CT scan images. By my interpretation the infrarenal aorta measures 3.7 cm in maximum diameter which has not changed in the last year. The right common iliac artery has enlarged from 1.572 2.1 cm. The left has gone from 1.47-1.59 cm.  MEDICAL ISSUES:  ABDOMINAL AORTIC ANEURYSM: The patient is doing well status post endovascular aneurysm repair with an AnerRx raft in 2002. He has a small right common iliac artery aneurysm which is enlarged only slightly. I explained we would not consider elective repair of this and less enlarged to 3.5 cm or greater. He is not a smoker. He remains fairly active. I have ordered a follow up CT scan in 1 year and I'll see him back at that time. He knows to call sooner if he has problems.   Return in about 1 year (around 09/28/2015).   Gloyd Happ S [facility] [place] Beeper: [phone]

## 2014-09-29 NOTE — Addendum Note (Signed)
Addended by: Sharee PimpleMCCHESNEY, MARILYN K on: 09/29/2014 02:58 PM   Modules accepted: Orders

## 2015-09-26 ENCOUNTER — Other Ambulatory Visit: Payer: Self-pay | Admitting: *Deleted

## 2015-09-26 DIAGNOSIS — Z01812 Encounter for preprocedural laboratory examination: Secondary | ICD-10-CM

## 2015-09-27 ENCOUNTER — Ambulatory Visit
Admission: RE | Admit: 2015-09-27 | Discharge: 2015-09-27 | Disposition: A | Discharge status: Home / Self Care | Payer: Medicare PPO | Source: Ambulatory Visit | Attending: Vascular Surgery | Admitting: Vascular Surgery

## 2015-09-27 DIAGNOSIS — Z95828 Presence of other vascular implants and grafts: Secondary | ICD-10-CM

## 2015-09-27 DIAGNOSIS — I714 Abdominal aortic aneurysm, without rupture, unspecified: Secondary | ICD-10-CM

## 2015-09-27 MED ORDER — IOPAMIDOL (ISOVUE-370) INJECTION 76%
75.0000 mL | Freq: Once | INTRAVENOUS | Status: AC | PRN
  Administered 2015-09-27: 75 mL via INTRAVENOUS

## 2015-10-02 ENCOUNTER — Encounter: Payer: Self-pay | Admitting: Vascular Surgery

## 2015-10-04 ENCOUNTER — Ambulatory Visit: Payer: Medicare Other | Admitting: Vascular Surgery

## 2015-10-05 ENCOUNTER — Ambulatory Visit (INDEPENDENT_AMBULATORY_CARE_PROVIDER_SITE_OTHER): Payer: Medicare PPO | Admitting: Vascular Surgery

## 2015-10-05 ENCOUNTER — Encounter: Payer: Self-pay | Admitting: Vascular Surgery

## 2015-10-05 VITALS — BP 115/72 | HR 81 | Ht 71.5 in | Wt 198.0 lb

## 2015-10-05 DIAGNOSIS — Z48812 Encounter for surgical aftercare following surgery on the circulatory system: Secondary | ICD-10-CM

## 2015-10-05 NOTE — Progress Notes (Signed)
[facility] [place]  Patient name: Jimmy Evans MRN: [id number] DOB: Jan 15, 1941 Sex: male  REASON FOR VISIT: follow up of EVAR.  HPI: Jimmy Evans is a 75 y.o. male who underwent an endovascular aortic aneurysm repair in March 2002. This was with an AneuRx graft. I last saw him in March 2016. At that time, the infrarenal aorta measured 3.7 cm in maximum diameter which was stable. The right common iliac artery had enlarged to 2.1 cm. The left common iliac artery was 1.59 cm.   Since I saw him last year, he denies any significant abdominal pain or back pain. He is not a smoker. His blood pressure has been well controlled. There have been no significant changes in his medical history. He continues to ride his motorcycle.    Past Medical History  Diagnosis Date  . AAA (abdominal aortic aneurysm) (HCC)   . Hyperlipidemia   . CAD (coronary artery disease)   . COPD (chronic obstructive pulmonary disease) (HCC)   . BPH (benign prostatic hyperplasia)   . Irregular heart beat   . Diabetes mellitus   . Myocardial infarction [facility])     Family History  Problem Relation Age of Onset  . Hyperlipidemia Mother     SOCIAL HISTORY: Social History  Substance Use Topics  . Smoking status: Former Smoker    Quit date: 07/22/2008  . Smokeless tobacco: Never Used  . Alcohol Use: 0.6 oz/week    1 Shots of liquor per week    No Known Allergies  Current Outpatient Prescriptions  Medication Sig Dispense Refill  . ALPRAZolam (XANAX) 1 MG tablet Take 0.5 mg by mouth at bedtime as needed.     Marland Kitchen atorvastatin (LIPITOR) 20 MG tablet Take 20 mg by mouth daily.    . B-D ULTRA-FINE 33 LANCETS MISC by Does not apply route.    Marland Kitchen buPROPion (ZYBAN) 150 MG 12 hr tablet Take 150 mg by mouth 2 (two) times daily.    . carvedilol (COREG) 6.25 MG tablet Take 6.25 mg by mouth 2 (two) times daily with a meal.    . digoxin (LANOXIN) 0.25 MG tablet Take 250 mcg by mouth daily.    Marland Kitchen doxazosin  (CARDURA) 4 MG tablet Take 4 mg by mouth at bedtime.    . gabapentin (NEURONTIN) 300 MG capsule Take 300 mg by mouth 3 (three) times daily.    . insulin NPH (HUMULIN N,NOVOLIN N) 100 UNIT/ML injection Inject into the skin.    . Liraglutide (VICTOZA) 18 MG/3ML SOPN Inject into the skin daily.    . Menthol-Methyl Salicylate (THERA-GESIC EX) Apply 85 g topically.    . metFORMIN (GLUCOPHAGE) 1000 MG tablet Take 1,000 mg by mouth 2 (two) times daily with a meal.    . mirtazapine (REMERON) 7.5 MG tablet Take 7.5 mg by mouth at bedtime.    . Pregnancy Test (PRECISE PREGNANCY VI) by In Vitro route.    . warfarin (COUMADIN) 5 MG tablet Take 5 mg by mouth daily.     No current facility-administered medications for this visit.    REVIEW OF SYSTEMS:   denotes positive finding,  denotes negative finding Cardiac  Comments:  Chest pain or chest pressure:    Shortness of breath upon exertion:    Short of breath when lying flat:    Irregular heart rhythm:        Vascular    Pain in calf, thigh, or hip brought on by ambulation:  Pain in feet at night that wakes you up from your sleep:     Blood clot in your veins:    Leg swelling:         Pulmonary    Oxygen at home:    Productive cough:     Wheezing:         Neurologic    Sudden weakness in arms or legs:     Sudden numbness in arms or legs:     Sudden onset of difficulty speaking or slurred speech:    Temporary loss of vision in one eye:     Problems with dizziness:         Gastrointestinal    Blood in stool:     Vomited blood:         Genitourinary    Burning when urinating:     Blood in urine:        Psychiatric    Major depression:         Hematologic    Bleeding problems:    Problems with blood clotting too easily:        Skin    Rashes or ulcers:        Constitutional    Fever or chills:      PHYSICAL EXAM: Filed Vitals:   10/05/15 1547  BP: 115/72  Pulse: 81  Height: 5' 11.5" (1.816 m)  Weight: 198 lb  (89.812 kg)  SpO2: 96%    GENERAL: The patient is a well-nourished male, in no acute distress. The vital signs are documented above. CARDIAC: There is a regular rate and rhythm.  VASCULAR: I do not detect carotid bruits. He has palpable femoral pulses. Both feet are warm and well-perfused. PULMONARY: There is good air exchange bilaterally without wheezing or rales. ABDOMEN: Soft and non-tender with normal pitched bowel sounds.  MUSCULOSKELETAL: There are no major deformities or cyanosis. NEUROLOGIC: No focal weakness or paresthesias are detected. SKIN: There are no ulcers or rashes noted. PSYCHIATRIC: The patient has a normal affect.  DATA:   CT SCAN ABDOMEN PELVIS: I have reviewed the images of the CT angiogram which was done on 09/27/2015. This shows that the maximum diameter of his aneurysm is 3.6 cm. The right common iliac artery measures 2.1 cm. The left common iliac artery measures 1.6 cm. These measurements have not changed over the last year.  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 I have ordered a follow up CT scan in 1 year. Given his size I think it is best to assess this with CT scan and not ultrasound. He is agreeable with this plan. Fortunately he is not a smoker. His blood pressure is under good control. I will see him back in 1 year. He knows to call sooner if he has problems.  Waverly Ferrariickson, Christopher [facility] [place] Beeper: [phone]

## 2015-10-06 NOTE — Addendum Note (Signed)
Addended by: Melodye PedMANESS-HARRISON, Thara Searing C on: 10/06/2015 11:31 AM   Modules accepted: Orders

## 2015-10-11 ENCOUNTER — Ambulatory Visit: Payer: Medicare Other | Admitting: Vascular Surgery

## 2016-09-23 ENCOUNTER — Other Ambulatory Visit: Payer: Medicare PPO

## 2016-09-27 ENCOUNTER — Encounter: Payer: Self-pay | Admitting: Vascular Surgery

## 2016-10-09 ENCOUNTER — Encounter: Payer: Self-pay | Admitting: Vascular Surgery

## 2016-10-09 ENCOUNTER — Ambulatory Visit (INDEPENDENT_AMBULATORY_CARE_PROVIDER_SITE_OTHER): Payer: Medicare PPO | Admitting: Vascular Surgery

## 2016-10-09 ENCOUNTER — Ambulatory Visit
Admission: RE | Admit: 2016-10-09 | Discharge: 2016-10-09 | Disposition: A | Discharge status: Home / Self Care | Payer: Medicare PPO | Source: Ambulatory Visit | Attending: Vascular Surgery | Admitting: Vascular Surgery

## 2016-10-09 VITALS — BP 131/103 | HR 73 | Temp 97.7°F | Resp 16 | Ht 71.5 in | Wt 193.0 lb

## 2016-10-09 DIAGNOSIS — Z48812 Encounter for surgical aftercare following surgery on the circulatory system: Secondary | ICD-10-CM

## 2016-10-09 DIAGNOSIS — I714 Abdominal aortic aneurysm, without rupture, unspecified: Secondary | ICD-10-CM

## 2016-10-09 MED ORDER — IOPAMIDOL (ISOVUE-370) INJECTION 76%
75.0000 mL | Freq: Once | INTRAVENOUS | Status: AC | PRN
  Administered 2016-10-09: 75 mL via INTRAVENOUS

## 2016-10-09 NOTE — Progress Notes (Signed)
Patient name: Jimmy PaiFred R Shrake MRN: [id number] DOB: 01/20/1941 Sex: male  REASON FOR VISIT: Follow up after EVAR.  HPI: Jimmy Evans is a 76 y.o. male who I last saw on 10/05/2015. He underwent an endovascular aortic aneurysm repair in 2002 with an AneuRx graft. I reviewed his CT angiogram from 09/27/2015 which showed that the maximum diameter of his aneurysm was 3.6 cm. The right common iliac artery measured 2.1 cm in the left common iliac artery measured 1.6 cm. These have not changed over the last year. Recommended a follow up visit in 1 year.  Since I saw him last, he has no specific complaints. He does have some chronic low back pain. He has not had any abdominal pain. He is not a smoker. [place]  Past Medical History:  Diagnosis Date  . AAA (abdominal aortic aneurysm) (HCC)   . BPH (benign prostatic hyperplasia)   . CAD (coronary artery disease)   . COPD (chronic obstructive pulmonary disease) (HCC)   . Diabetes mellitus   . Hyperlipidemia   . Irregular heart beat   . Myocardial infarction     Family History  Problem Relation Age of Onset  . Hyperlipidemia Mother     SOCIAL HISTORY: Social History  Substance Use Topics  . Smoking status: Former Smoker    Quit date: 07/22/2008  . Smokeless tobacco: Never Used  . Alcohol use 0.6 oz/week    1 Shots of liquor per week    No Known Allergies  Current Outpatient Prescriptions  Medication Sig Dispense Refill  . ALPRAZolam (XANAX) 1 MG tablet Take 0.5 mg by mouth at bedtime as needed.     Marland Kitchen. atorvastatin (LIPITOR) 20 MG tablet Take 20 mg by mouth daily.    . B-D ULTRA-FINE 33 LANCETS MISC by Does not apply route.    Marland Kitchen. buPROPion (ZYBAN) 150 MG 12 hr tablet Take 150 mg by mouth 2 (two) times daily.    . carvedilol (COREG) 6.25 MG tablet Take 6.25 mg by mouth 2 (two) times daily with a meal.    . digoxin (LANOXIN) 0.25 MG tablet Take 250 mcg by mouth daily.    Marland Kitchen. doxazosin (CARDURA) 4 MG  tablet Take 4 mg by mouth at bedtime.    . gabapentin (NEURONTIN) 300 MG capsule Take 300 mg by mouth 3 (three) times daily.    . insulin NPH (HUMULIN N,NOVOLIN N) 100 UNIT/ML injection Inject into the skin.    . Liraglutide (VICTOZA) 18 MG/3ML SOPN Inject into the skin daily.    . Menthol-Methyl Salicylate (THERA-GESIC EX) Apply 85 g topically.    . metFORMIN (GLUCOPHAGE) 1000 MG tablet Take 1,000 mg by mouth 2 (two) times daily with a meal.    . mirtazapine (REMERON) 7.5 MG tablet Take 7.5 mg by mouth at bedtime.    . Pregnancy Test (PRECISE PREGNANCY VI) by In Vitro route.    . warfarin (COUMADIN) 5 MG tablet Take 5 mg by mouth daily.     No current facility-administered medications for this visit.     REVIEW OF SYSTEMS:  [X]  denotes positive finding, [ ]  denotes negative finding Cardiac  Comments:  Chest pain or chest pressure:    Shortness of breath upon exertion:    Short of breath when lying flat:    Irregular heart rhythm:        Vascular    Pain in calf, thigh, or hip brought on by ambulation:  Pain in feet at night that wakes you up from your sleep:     Blood clot in your veins:    Leg swelling:         Pulmonary    Oxygen at home:    Productive cough:     Wheezing:         Neurologic    Sudden weakness in arms or legs:     Sudden numbness in arms or legs:     Sudden onset of difficulty speaking or slurred speech:    Temporary loss of vision in one eye:     Problems with dizziness:         Gastrointestinal    Blood in stool:     Vomited blood:         Genitourinary    Burning when urinating:     Blood in urine:        Psychiatric    Major depression:         Hematologic    Bleeding problems:    Problems with blood clotting too easily:        Skin    Rashes or ulcers:        Constitutional    Fever or chills:      PHYSICAL EXAM: Vitals:   10/09/16 1135  BP: (!) 131/103  Pulse: 73  Resp: 16  Temp: 97.7 F (36.5 C)  TempSrc: Oral  SpO2:  (!) 20%  Weight: 193 lb (87.5 kg)  Height: 5' 11.5" (1.816 m)    GENERAL: The patient is a well-nourished male, in no acute distress. The vital signs are documented above. CARDIAC: There is a regular rate and rhythm.  VASCULAR: I do not detect carotid bruits. He has palpable femoral pulses. He has no significant lower extremity swelling. PULMONARY: There is good air exchange bilaterally without wheezing or rales. ABDOMEN: Soft and non-tender with normal pitched bowel sounds.  MUSCULOSKELETAL: There are no major deformities or cyanosis. NEUROLOGIC: No focal weakness or paresthesias are detected. SKIN: There are no ulcers or rashes noted. PSYCHIATRIC: The patient has a normal affect.  DATA:   CT ANGIOGRAM ABDOMEN AND PELVIS: I have reviewed the images of the CT angiogram of the abdomen and pelvis. The maximum diameter of the aneurysm is 3.6 cm. The maximum diameter of the right common iliac artery is 2.1 cm. The maximum diameter of the left common iliac artery is 1.6 cm.  MEDICAL ISSUES:  FOLLOW UP AFTER EVAR: This patient underwent an endovascular aneurysm repair 2002. He comes in for a 1 year follow up visit. His CT scan shows no change in size of his aneurysm and no evidence of endoleak. He is doing well now 16 years after endovascular repair of his aneurysm. I have ordered an ultrasound in 1 year and I'll see him back at that time. He knows to call sooner if he has problems.   Waverly Ferrari [facility] [place] [phone]

## 2016-10-10 NOTE — Addendum Note (Signed)
Addended by: Burton ApleyPETTY, Riddick Nuon A on: 10/10/2016 02:08 PM   Modules accepted: Orders

## 2016-10-16 ENCOUNTER — Ambulatory Visit: Payer: Medicare PPO | Admitting: Vascular Surgery

## 2017-04-30 IMAGING — CT CT CTA ABD/PEL W/CM AND/OR W/O CM
3 of 10 series · 10 of 36 positions shown, 15 images · IV contrast (75CC ISOVUE 370)
Comparison: 09/28/2014

CLINICAL DATA: Status post stent graft repair of abdominal aortic
aneurysm in 6006, occasional right-sided pain, followup exam

EXAM:
CTA ABDOMEN AND PELVIS wITHOUT AND WITH CONTRAST
TECHNIQUE: Multidetector CT imaging of the abdomen and pelvis was performed
using the standard protocol during bolus administration of
intravenous contrast. Multiplanar reconstructed images and MIPs were
obtained and reviewed to evaluate the vascular anatomy.
CONTRAST:  75 mL Isovue 370

[Series 5: angio 2.5 · axial · 0.79mm/px · z∈[-344,-77]mm · 4 of 179 slices shown, 9 images]
[im 36/179  soft-tissue]
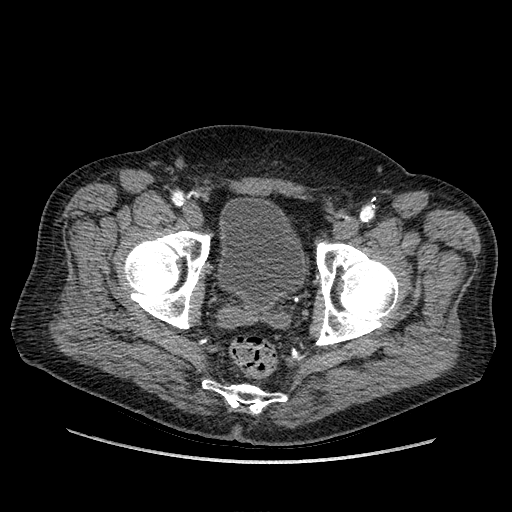
[im 36/179  lung]
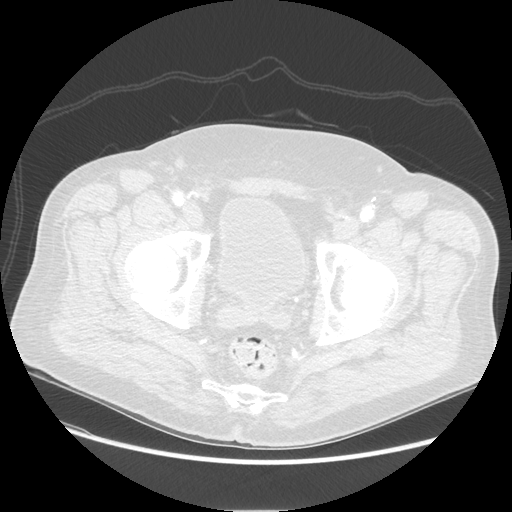
[im 36/179  bone]
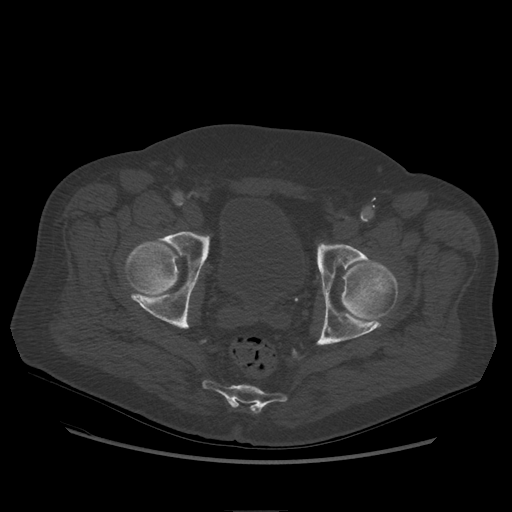
[im 72/179  soft-tissue]
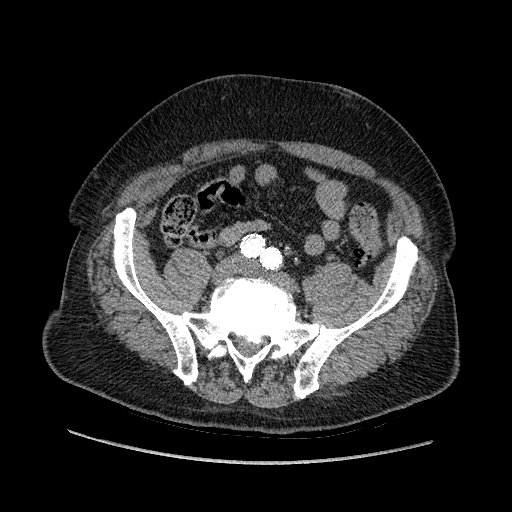
[im 72/179  lung]
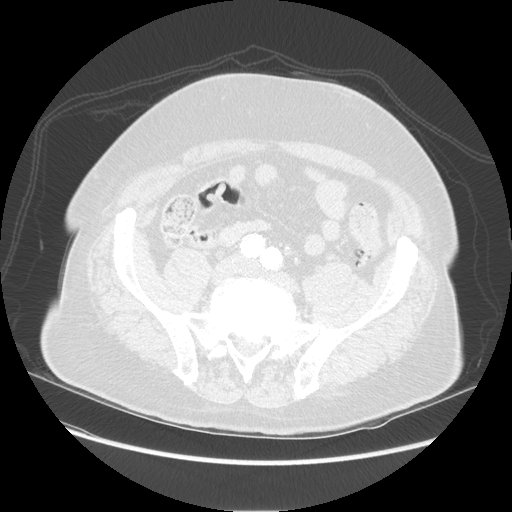
[im 107/179  soft-tissue]
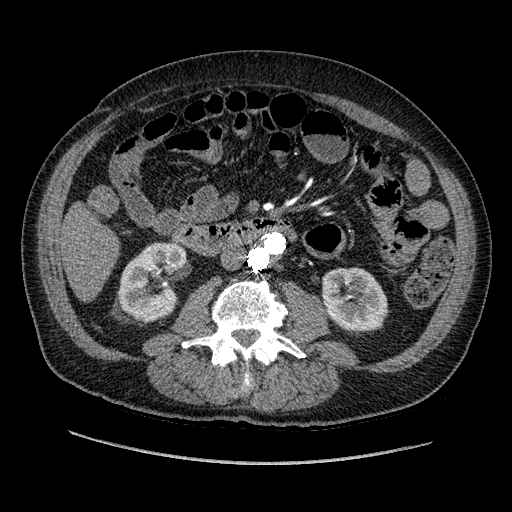
[im 107/179  lung]
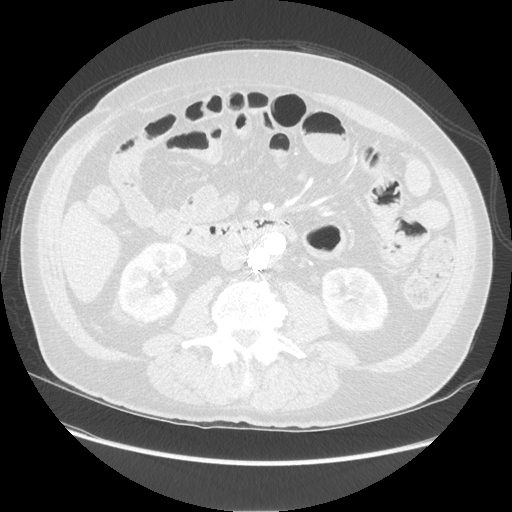
[im 143/179  soft-tissue]
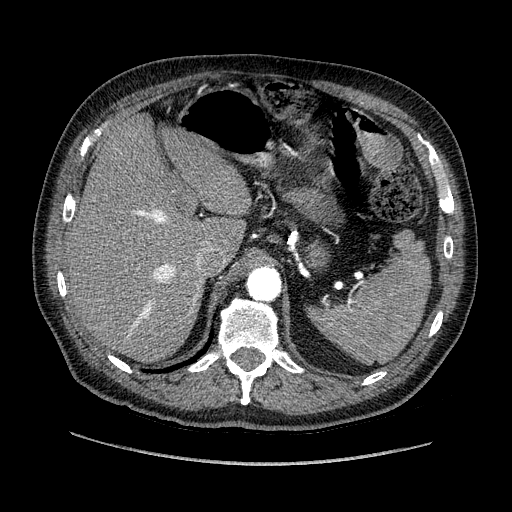
[im 143/179  lung]
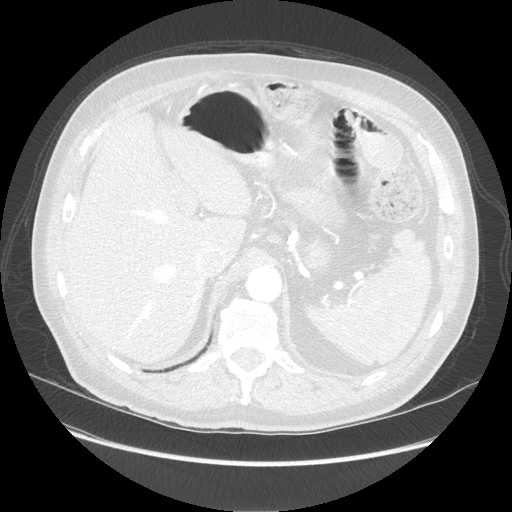

[Series 602: sagittal body · sagittal · 0.87mm/px · 3 of 160 slices shown (1 of 2)]
[im 40/160  soft-tissue]
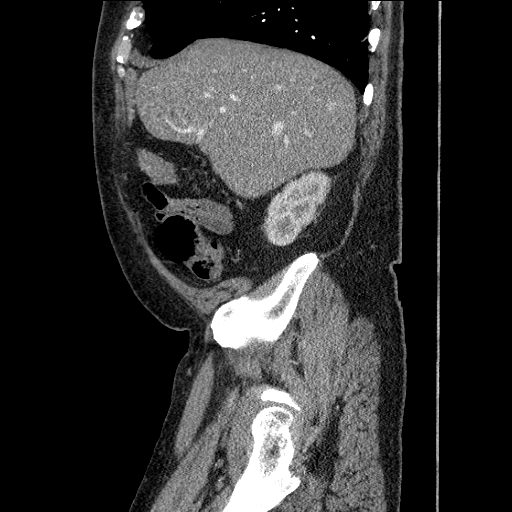
[im 80/160  soft-tissue]
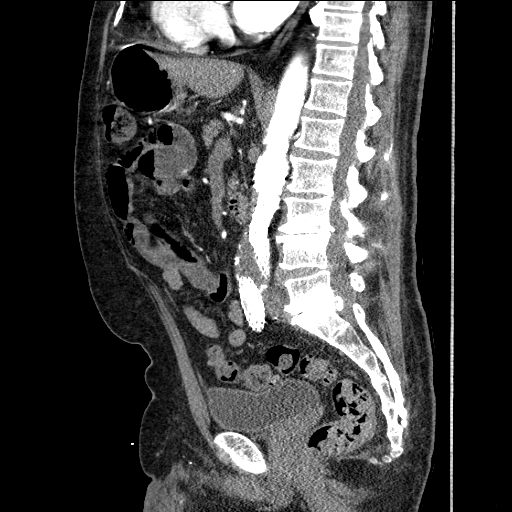
[im 120/160  soft-tissue]
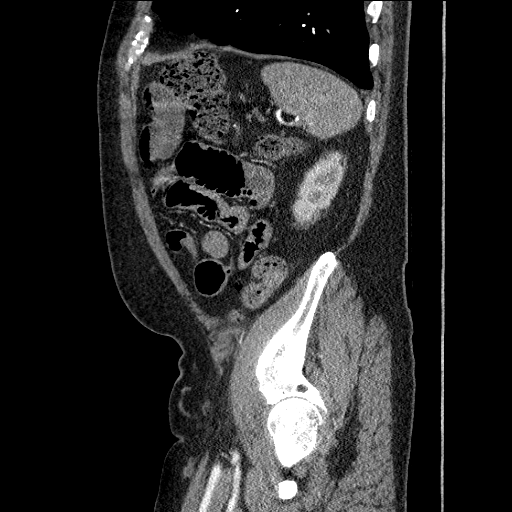

[Series 607: sagittal body · sagittal · 0.79mm/px · 3 of 152 slices shown (2 of 2)]
[im 38/152  soft-tissue]
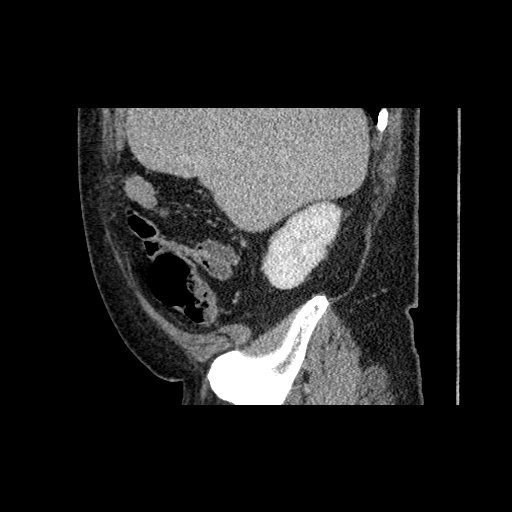
[im 76/152  soft-tissue]
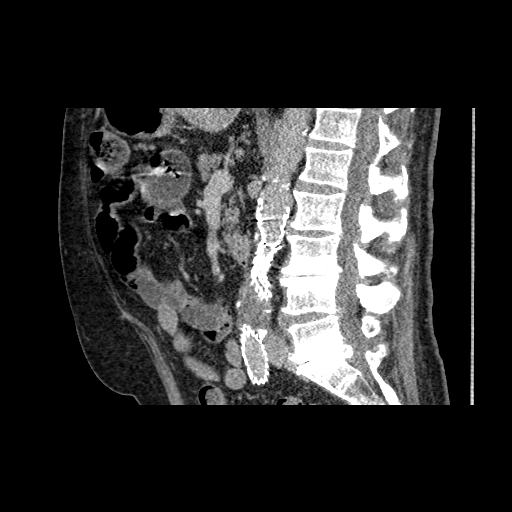
[im 114/152  soft-tissue]
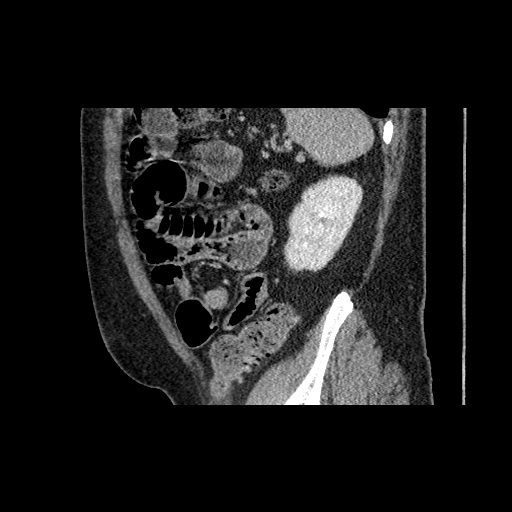

[10 of 36 positions shown; findings below may reference images not displayed]

FINDINGS: Vascular: There has been endovascular stent graft repair of an
abdominal aortic aneurysm. The aneurysm sac is stable at
approximately 3.6 cm in greatest diameter. The stent graft is widely
patent. No endoleak is identified.

The celiac axis, superior mesenteric artery and bilateral single
renal arteries are again identified and stable in appearance. The
inferior mesenteric artery reconstitutes via collaterals from the
superior mesenteric artery. Stable dilatation of the right common
iliac artery is noted distally. Diffuse atherosclerotic
calcifications are seen.

Nonvascular: Lung bases are free of acute infiltrate or sizable
effusion. The liver, spleen, adrenal glands and pancreas are within
normal limits. The kidneys are well visualized bilaterally and again
reveal a normal enhancement pattern and normal excretion. The
appendix and gallbladder are not visualize consistent with a prior
surgical history.

The bladder is well distended. No pelvic mass lesion is seen.
Scattered diverticular change of the colon is noted. Degenerative
changes of the lumbar spine are again seen and stable. Small fat
containing umbilical hernia is noted.

Review of the MIP images confirms the above findings.
IMPRESSION: Status post endovascular repair of abdominal aortic aneurysm. The
aneurysm sac is stable from the prior exam. No endoleak is
identified.

Stable dilatation of the right common iliac artery.

No acute abnormality is noted.

## 2017-10-15 ENCOUNTER — Other Ambulatory Visit: Payer: Self-pay

## 2017-10-15 ENCOUNTER — Encounter: Payer: Self-pay | Admitting: Vascular Surgery

## 2017-10-15 ENCOUNTER — Ambulatory Visit: Payer: Medicare PPO | Admitting: Vascular Surgery

## 2017-10-15 ENCOUNTER — Ambulatory Visit (HOSPITAL_COMMUNITY)
Admission: RE | Admit: 2017-10-15 | Discharge: 2017-10-15 | Disposition: A | Discharge status: Home / Self Care | Payer: Medicare PPO | Source: Ambulatory Visit | Attending: Vascular Surgery | Admitting: Vascular Surgery

## 2017-10-15 VITALS — BP 141/74 | HR 66 | Temp 97.9°F | Resp 16 | Ht 71.5 in | Wt 196.0 lb

## 2017-10-15 DIAGNOSIS — I714 Abdominal aortic aneurysm, without rupture, unspecified: Secondary | ICD-10-CM

## 2017-10-15 NOTE — Progress Notes (Signed)
Patient name: Jimmy Evans MRN: [id number] DOB: 08/29/1940 Sex: male  REASON FOR VISIT:   Follow-up after endovascular aneurysm repair  HPI:   Jimmy Evans is a pleasant 77 y.o. male who underwent endovascular aortic aneurysm repair in 2002 with an AneuRx graft.  He comes in for yearly follow-up visit.  His most recent CT angiogram was in March 2017 which showed that the maximum diameter of his aorta was 3.6 cm.  The right common iliac artery measured 2.1 cm in maximum diameter in the left common iliac artery measured 1.6 cm in maximum diameter.  Since I saw him last, he denies any abdominal pain or back pain.  His blood pressure has been under good control.  He is not a smoker.  He is not on aspirin because he is on Coumadin for atrial fibrillation.  He is on a statin.  Past Medical History:  Diagnosis Date  . AAA (abdominal aortic aneurysm) (HCC)   . BPH (benign prostatic hyperplasia)   . CAD (coronary artery disease)   . COPD (chronic obstructive pulmonary disease) (HCC)   . Diabetes mellitus   . Hyperlipidemia   . Irregular heart beat   . Myocardial infarction [facility](HCC)     Family History  Problem Relation Age of Onset  . Hyperlipidemia Mother     SOCIAL HISTORY: Social History   Tobacco Use  . Smoking status: Former Smoker    Last attempt to quit: 07/22/2008    Years since quitting: 9.2  . Smokeless tobacco: Never Used  Substance Use Topics  . Alcohol use: Yes    Alcohol/week: 0.6 oz    Types: 1 Shots of liquor per week    No Known Allergies  Current Outpatient Medications  Medication Sig Dispense Refill  . ALPRAZolam (XANAX) 1 MG tablet Take 0.5 mg by mouth at bedtime as needed.     Marland Kitchen. atorvastatin (LIPITOR) 20 MG tablet Take 20 mg by mouth daily.    . B-D ULTRA-FINE 33 LANCETS MISC by Does not apply route.    Marland Kitchen. buPROPion (ZYBAN) 150 MG 12 hr tablet Take 150 mg by mouth 2 (two) times daily.    . carvedilol (COREG) 6.25 MG tablet Take 6.25 mg by mouth 2 (two) times  daily with a meal.    . digoxin (LANOXIN) 0.25 MG tablet Take 250 mcg by mouth daily.    Marland Kitchen. doxazosin (CARDURA) 4 MG tablet Take 4 mg by mouth at bedtime.    . gabapentin (NEURONTIN) 300 MG capsule Take 300 mg by mouth 3 (three) times daily.    . insulin NPH (HUMULIN N,NOVOLIN N) 100 UNIT/ML injection Inject into the skin.    . Liraglutide (VICTOZA) 18 MG/3ML SOPN Inject into the skin daily.    . Menthol-Methyl Salicylate (THERA-GESIC EX) Apply 85 g topically.    . metFORMIN (GLUCOPHAGE) 1000 MG tablet Take 1,000 mg by mouth 2 (two) times daily with a meal.    . mirtazapine (REMERON) 7.5 MG tablet Take 7.5 mg by mouth at bedtime.    . Pregnancy Test (PRECISE PREGNANCY VI) by In Vitro route.    . warfarin (COUMADIN) 5 MG tablet Take 5 mg by mouth daily.     No current facility-administered medications for this visit.     REVIEW OF SYSTEMS:  [X]  denotes positive finding, [ ]  denotes negative finding Cardiac  Comments:  Chest pain or chest pressure:    Shortness of breath upon exertion:    Short of breath when  lying flat:    Irregular heart rhythm: x       Vascular    Pain in calf, thigh, or hip brought on by ambulation: x   Pain in feet at night that wakes you up from your sleep:     Blood clot in your veins:    Leg swelling:         Pulmonary    Oxygen at home:    Productive cough:     Wheezing:         Neurologic    Sudden weakness in arms or legs:     Sudden numbness in arms or legs:     Sudden onset of difficulty speaking or slurred speech:    Temporary loss of vision in one eye:     Problems with dizziness:  x       Gastrointestinal    Blood in stool:     Vomited blood:         Genitourinary    Burning when urinating:     Blood in urine:        Psychiatric    Major depression:         Hematologic    Bleeding problems:    Problems with blood clotting too easily:        Skin    Rashes or ulcers:        Constitutional    Fever or chills:     PHYSICAL EXAM:    Vitals:   10/15/17 1030  BP: (!) 141/74  Pulse: 66  Resp: 16  Temp: 97.9 F (36.6 C)  TempSrc: Oral  SpO2: 100%  Weight: 196 lb (88.9 kg)  Height: 5' 11.5" (1.816 m)    GENERAL: The patient is a well-nourished male, in no acute distress. The vital signs are documented above. CARDIAC: There is a regular rate and rhythm.  VASCULAR: I do not detect carotid bruits. He has palpable femoral pulses. He has no significant lower extremity swelling. PULMONARY: There is good air exchange bilaterally without wheezing or rales. ABDOMEN: Soft and non-tender with normal pitched bowel sounds.  MUSCULOSKELETAL: There are no major deformities or cyanosis. NEUROLOGIC: No focal weakness or paresthesias are detected. SKIN: There are no ulcers or rashes noted. PSYCHIATRIC: The patient has a normal affect.  DATA:    DUPLEX ABDOMINAL AORTA AND ILIAC ARTERIES: I have independently interpreted his duplex of the abdominal aorta and iliac arteries.  The maximum diameter of his aneurysm is 3.6 cm.  This has not changed in size.  The maximum diameter of his right common iliac artery is 1.6 cm.  The maximum diameter of his left common iliac artery is 1.3 cm.  MEDICAL ISSUES:   STATUS POST EVAR: This patient underwent endovascular aneurysm repair in 2002.  His aneurysm remains stable in size.  His iliac arteries remained stable in size.  His most recent CT scan showed no evidence of endoleak.  I ordered a follow-up duplex in 1 year and I will see him back at that time.  He knows to call sooner if he has problems.  Fortunately he is not a smoker.  In addition his blood pressure is under good control.  Waverly Ferrari [facility] [phone]

## 2018-10-27 ENCOUNTER — Other Ambulatory Visit: Payer: Self-pay

## 2018-10-27 DIAGNOSIS — I714 Abdominal aortic aneurysm, without rupture, unspecified: Secondary | ICD-10-CM

## 2018-11-11 ENCOUNTER — Other Ambulatory Visit (HOSPITAL_COMMUNITY): Payer: Medicare PPO

## 2018-11-11 ENCOUNTER — Ambulatory Visit: Payer: Medicare PPO | Admitting: Vascular Surgery

## 2019-02-24 ENCOUNTER — Other Ambulatory Visit (HOSPITAL_COMMUNITY): Payer: Medicare PPO

## 2019-02-24 ENCOUNTER — Ambulatory Visit: Payer: Medicare PPO | Admitting: Vascular Surgery

## 2019-04-07 ENCOUNTER — Other Ambulatory Visit (HOSPITAL_COMMUNITY): Payer: Medicare PPO

## 2019-04-07 ENCOUNTER — Ambulatory Visit: Payer: Medicare PPO | Admitting: Vascular Surgery

## 2019-05-11 ENCOUNTER — Telehealth (HOSPITAL_COMMUNITY): Payer: Self-pay

## 2019-05-11 NOTE — Telephone Encounter (Signed)

## 2019-05-12 ENCOUNTER — Encounter: Payer: Self-pay | Admitting: Vascular Surgery

## 2019-05-12 ENCOUNTER — Ambulatory Visit (HOSPITAL_COMMUNITY)
Admission: RE | Admit: 2019-05-12 | Discharge: 2019-05-12 | Disposition: A | Discharge status: Home / Self Care | Payer: Medicare PPO | Source: Ambulatory Visit | Attending: Vascular Surgery | Admitting: Vascular Surgery

## 2019-05-12 ENCOUNTER — Ambulatory Visit (INDEPENDENT_AMBULATORY_CARE_PROVIDER_SITE_OTHER): Payer: Medicare PPO | Admitting: Vascular Surgery

## 2019-05-12 ENCOUNTER — Other Ambulatory Visit: Payer: Self-pay

## 2019-05-12 VITALS — BP 145/81 | HR 82 | Temp 97.5°F | Resp 20 | Ht 71.5 in | Wt 177.0 lb

## 2019-05-12 DIAGNOSIS — I714 Abdominal aortic aneurysm, without rupture, unspecified: Secondary | ICD-10-CM

## 2019-05-12 NOTE — Progress Notes (Signed)
Patient name: Jimmy Evans MRN: [id number] DOB: 02/14/1941 Sex: male  REASON FOR VISIT:   Follow-up after endovascular aneurysm repair  HPI:   Jimmy Evans is a pleasant 78 y.o. male who underwent endovascular repair of an abdominal aortic aneurysm in 2002.  This was with an AneuRx graft.  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  He comes in for a 1 year follow-up visit.  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  He denies abdominal pain or back pain.  Has had no claudication or rest pain.  Past Medical History:  Diagnosis Date  . AAA (abdominal aortic aneurysm) (HCC)   . BPH (benign prostatic hyperplasia)   . CAD (coronary artery disease)   . COPD (chronic obstructive pulmonary disease) (HCC)   . Diabetes mellitus   . Hyperlipidemia   . Irregular heart beat   . Myocardial infarction [facility])     Family History  Problem Relation Age of Onset  . Hyperlipidemia Mother     SOCIAL HISTORY: Social History   Tobacco Use  . Smoking status: Former Smoker    Quit date: 07/22/2008    Years since quitting: 10.8  . Smokeless tobacco: Never Used  Substance Use Topics  . Alcohol use: Yes    Alcohol/week: 1.0 standard drinks    Types: 1 Shots of liquor per week    No Known Allergies  Current Outpatient Medications  Medication Sig Dispense Refill  . ALPRAZolam (XANAX) 1 MG tablet Take 0.5 mg by mouth at bedtime as needed.     Marland Kitchen atorvastatin (LIPITOR) 20 MG tablet Take 20 mg by mouth daily.    . B-D ULTRA-FINE 33 LANCETS MISC by Does not apply route.    Marland Kitchen buPROPion (ZYBAN) 150 MG 12 hr tablet Take 150 mg by mouth 2 (two) times daily.    . carvedilol (COREG) 6.25 MG tablet Take 6.25 mg by mouth 2 (two) times daily with a meal.    .  digoxin (LANOXIN) 0.25 MG tablet Take 250 mcg by mouth daily.    Marland Kitchen doxazosin (CARDURA) 4 MG tablet Take 4 mg by mouth at bedtime.    . gabapentin (NEURONTIN) 300 MG capsule Take 300 mg by mouth 3 (three) times daily.    . insulin NPH (HUMULIN N,NOVOLIN N) 100 UNIT/ML injection Inject into the skin.    . Liraglutide (VICTOZA) 18 MG/3ML SOPN Inject into the skin daily.    . Menthol-Methyl Salicylate (THERA-GESIC EX) Apply 85 g topically.    . metFORMIN (GLUCOPHAGE) 1000 MG tablet Take 1,000 mg by mouth 2 (two) times daily with a meal.    . mirtazapine (REMERON) 7.5 MG tablet Take 7.5 mg by mouth at bedtime.    . Pregnancy Test (PRECISE PREGNANCY VI) by In Vitro route.    . warfarin (COUMADIN) 5 MG tablet Take 5 mg by mouth daily.     No current facility-administered medications for this visit.     REVIEW OF SYSTEMS:  [X]  denotes positive finding, [ ]  denotes negative finding Cardiac  Comments:  Chest pain or chest pressure:    Shortness of breath upon exertion:    Short of breath when lying flat:    Irregular  heart rhythm:        Vascular    Pain in calf, thigh, or hip brought on by ambulation:    Pain in feet at night that wakes you up from your sleep:     Blood clot in your veins:    Leg swelling:         Pulmonary    Oxygen at home:    Productive cough:     Wheezing:         Neurologic    Sudden weakness in arms or legs:     Sudden numbness in arms or legs:     Sudden onset of difficulty speaking or slurred speech:    Temporary loss of vision in one eye:     Problems with dizziness:         Gastrointestinal    Blood in stool:     Vomited blood:         Genitourinary    Burning when urinating:     Blood in urine:        Psychiatric    Major depression:         Hematologic    Bleeding problems:    Problems with blood clotting too easily:        Skin    Rashes or ulcers:        Constitutional    Fever or chills:     PHYSICAL EXAM:   Vitals:   05/12/19  0928  BP: (!) 145/81  Pulse: 82  Resp: 20  Temp: (!) 97.5 F (36.4 C)  SpO2: 98%  Weight: 177 lb (80.3 kg)  Height: 5' 11.5" (1.816 m)    GENERAL: The patient is a well-nourished male, in no acute distress. The vital signs are documented above. CARDIAC: There is a regular rate and rhythm.  VASCULAR: I do not detect carotid bruits. He has palpable femoral pulses.  I cannot palpate pedal pulses however both feet are warm and well-perfused. He has no significant lower extremity swelling. PULMONARY: There is good air exchange bilaterally without wheezing or rales. ABDOMEN: Soft and non-tender with normal pitched bowel sounds.  MUSCULOSKELETAL: There are no major deformities or cyanosis. NEUROLOGIC: No focal weakness or paresthesias are detected. SKIN: There are no ulcers or rashes noted. PSYCHIATRIC: The patient has a normal affect.  DATA:    DUPLEX ABDOMINAL AORTA: I have independently interpreted his duplex of the abdominal aorta.  The maximum diameter of his aneurysm is 3.7 cm.  The right common iliac artery measures 2.0 cm in maximum diameter which is not changed since 2017.  The left common iliac artery measures 1.9 cm in maximum diameter.  MEDICAL ISSUES:   STATUS POST ENDOVASCULAR ANEURYSM REPAIR: The patient is doing well status post endovascular aneurysm repair which was done in 2002.  He has been getting yearly duplex scans of his aorta to keep an eye on his slightly ectatic common iliac arteries.  He is moving to [place] so his records will be sent there where they will continue to follow his vascular disease.  Overall I am pleased with his progress and certainly I will miss him as a patient as we have been together for some time.  Deitra Mayo [facility] [phone]
